# Patient Record
Sex: Female | Born: 2000 | Race: Black or African American | Hispanic: No | Marital: Single | Smoking: Never smoker
Health system: Southern US, Community
[De-identification: ages and names within clinical notes are randomized; demographics above are authoritative.]

## PROBLEM LIST (undated history)

## (undated) DIAGNOSIS — F913 Oppositional defiant disorder: Secondary | ICD-10-CM

## (undated) DIAGNOSIS — F39 Unspecified mood [affective] disorder: Secondary | ICD-10-CM

## (undated) DIAGNOSIS — F902 Attention-deficit hyperactivity disorder, combined type: Secondary | ICD-10-CM

## (undated) DIAGNOSIS — E669 Obesity, unspecified: Secondary | ICD-10-CM

## (undated) HISTORY — DX: Unspecified mood (affective) disorder: F39

## (undated) HISTORY — DX: Oppositional defiant disorder: F91.3

## (undated) HISTORY — DX: Obesity, unspecified: E66.9

---
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
## 2020-01-02 ENCOUNTER — Other Ambulatory Visit: Payer: Self-pay | Admitting: Obstetrics

## 2020-01-02 DIAGNOSIS — R21 Rash and other nonspecific skin eruption: Secondary | ICD-10-CM

## 2020-01-02 MED ORDER — TRIAMCINOLONE ACETONIDE 0.1 % EX CREA: 1.0000 "application " | TOPICAL_CREAM | Freq: Two times a day (BID) | CUTANEOUS | 0 refills | Status: DC

## 2020-01-12 ENCOUNTER — Other Ambulatory Visit: Payer: Self-pay | Admitting: Obstetrics & Gynecology

## 2020-01-12 DIAGNOSIS — Z30011 Encounter for initial prescription of contraceptive pills: Secondary | ICD-10-CM

## 2020-01-20 MED ORDER — NORGESTIMATE-ETH ESTRADIOL 0.25-35 MG-MCG PO TABS: 1.0000 | ORAL_TABLET | Freq: Every day | ORAL | 4 refills | Status: DC

## 2020-01-20 NOTE — Addendum Note (Signed)
Addended by: Jaynie Collins A on: 01/20/2020 03:37 PM   Modules accepted: Orders

## 2020-01-29 ENCOUNTER — Encounter: Payer: Self-pay | Admitting: Obstetrics

## 2020-01-29 ENCOUNTER — Other Ambulatory Visit: Payer: Self-pay

## 2020-01-29 ENCOUNTER — Ambulatory Visit (INDEPENDENT_AMBULATORY_CARE_PROVIDER_SITE_OTHER): Payer: Medicaid Other | Admitting: Obstetrics

## 2020-01-29 VITALS — BP 132/82 | HR 93 | Ht 63.5 in | Wt 211.0 lb

## 2020-01-29 DIAGNOSIS — R21 Rash and other nonspecific skin eruption: Secondary | ICD-10-CM | POA: Diagnosis not present

## 2020-01-29 DIAGNOSIS — B369 Superficial mycosis, unspecified: Secondary | ICD-10-CM | POA: Diagnosis not present

## 2020-01-29 DIAGNOSIS — B379 Candidiasis, unspecified: Secondary | ICD-10-CM | POA: Diagnosis not present

## 2020-01-29 NOTE — Progress Notes (Signed)
Pt is in the office for follow up GYN visit related to vaginal itching. Pt states that triamcinolone cream worked and she no longer is having issues.   Patient ID: Brandy Choi, female   DOB: September 08, 2000, 19 y.o.   MRN: [id number]  Chief Complaint  Patient presents with  . Follow-up    HPI Brandy Choi is a 19 y.o. female.  History of vulva / inguinal jock-itch type rash.  Now resolved with Kenalog /  Clotrimazole creams. HPI  Past Medical History:  Diagnosis Date  . ADHD (attention deficit hyperactivity disorder), combined type   . Asthma   . Mood disorder (HCC)   . Obesity   . Oppositional defiant disorder     Past Surgical History:  Procedure Laterality Date  . CESAREAN SECTION N/A 05/28/2018   Procedure: CESAREAN SECTION;  Surgeon: Conan Bowens, MD;  Location: [facility] BIRTHING SUITES;  Service: Obstetrics;  Laterality: N/A;  . WISDOM TOOTH EXTRACTION  2019    Family History  Problem Relation Age of Onset  . ADD / ADHD Brother   . Diabetes Mother   . Congestive Heart Failure Maternal Grandmother   . Renal Disease Maternal Grandmother   . Diabetes Maternal Grandmother   . Hypertension Maternal Grandmother     Social History Social History   Tobacco Use  . Smoking status: Never Smoker  . Smokeless tobacco: Never Used  Vaping Use  . Vaping Use: Never used  Substance Use Topics  . Alcohol use: No  . Drug use: No    No Known Allergies  Current Outpatient Medications  Medication Sig Dispense Refill  . norgestimate-ethinyl estradiol (ESTARYLLA) 0.25-35 MG-MCG tablet Take 1 tablet by mouth daily. 84 tablet 4  . clotrimazole (LOTRIMIN) 1 % cream Apply 1 application topically 2 (two) times daily. (Patient not taking: Reported on 01/29/2020) 113 g 1  . famotidine (PEPCID) 20 MG tablet Take 1 tablet (20 mg total) by mouth 2 (two) times daily. (Patient not taking: Reported on 01/29/2020) 30 tablet 0  . fluconazole (DIFLUCAN) 150 MG tablet Take 1 tablet (150 mg total) by  mouth daily. Repeat in 1 week if needed (Patient not taking: Reported on 12/23/2019) 2 tablet 0  . methylphenidate 18 MG PO CR tablet Take 18 mg by mouth daily. (Patient not taking: Reported on 01/29/2020)    . triamcinolone cream (KENALOG) 0.1 % Apply 1 application topically 2 (two) times daily. (Patient not taking: Reported on 01/29/2020) 80 g 0   No current facility-administered medications for this visit.    Review of Systems Review of Systems Constitutional: negative for fatigue and weight loss Respiratory: negative for cough and wheezing Cardiovascular: negative for chest pain, fatigue and palpitations Gastrointestinal: negative for abdominal pain and change in bowel habits Genitourinary:negative Integument/breast: negative for nipple discharge Musculoskeletal:negative for myalgias Neurological: negative for gait problems and tremors Behavioral/Psych: negative for abusive relationship, depression Endocrine: negative for temperature intolerance      Blood pressure 132/82, pulse 93, height 5' 3.5" (1.613 m), weight 211 lb (95.7 kg), not currently breastfeeding.  Physical Exam Physical Exam: Deferred General:   alert and no distress  Skin:   no rash or abnormalities  Lungs:   clear to auscultation bilaterally  Heart:   regular rate and rhythm, S1, S2 normal, no murmur, click, rub or gallop  Breasts:   normal without suspicious masses, skin or nipple changes or axillary nodes  Abdomen:  normal findings: no organomegaly, soft, non-tender and no hernia  Pelvis:  External genitalia: normal general appearance Urinary system: urethral meatus normal and bladder without fullness, nontender Vaginal: normal without tenderness, induration or masses Cervix: normal appearance Adnexa: normal bimanual exam Uterus: anteverted and non-tender, normal size    50% of 15 min visit spent on counseling and coordination of care.   Data Reviewed Wet Prep  Assessment     1. Candida infection -  resolved  2. Superficial fungus infection of skin - resolved  3. Rash and nonspecific skin eruption - resolved    Plan   Follow up prn  Brock Bad, MD 01/29/2020 4:39 PM

## 2020-02-13 ENCOUNTER — Other Ambulatory Visit (HOSPITAL_COMMUNITY)
Admission: RE | Admit: 2020-02-13 | Discharge: 2020-02-13 | Disposition: A | Discharge status: Home / Self Care | Payer: Medicaid Other | Source: Ambulatory Visit | Attending: Obstetrics and Gynecology | Admitting: Obstetrics and Gynecology

## 2020-02-13 ENCOUNTER — Ambulatory Visit: Payer: Medicaid Other

## 2020-02-13 ENCOUNTER — Other Ambulatory Visit: Payer: Self-pay

## 2020-02-13 DIAGNOSIS — N898 Other specified noninflammatory disorders of vagina: Secondary | ICD-10-CM | POA: Insufficient documentation

## 2020-02-13 NOTE — Progress Notes (Signed)
Patient was assessed and managed by nursing staff during this encounter. I have reviewed the chart and agree with the documentation and plan. I have also made any necessary editorial changes.  Catalina Antigua, MD 02/13/2020 10:46 AM

## 2020-02-13 NOTE — Progress Notes (Signed)
Pt is in the office for self swab, reports vaginal odor, and "sees rolls of dirt on the tissue after wiping", denies discharge.

## 2020-02-14 LAB — CERVICOVAGINAL ANCILLARY ONLY
Bacterial Vaginitis (gardnerella): NEGATIVE
Candida Glabrata: NEGATIVE
Candida Vaginitis: NEGATIVE
Chlamydia: NEGATIVE
Comment: NEGATIVE
Comment: NEGATIVE
Comment: NEGATIVE
Comment: NEGATIVE
Comment: NEGATIVE
Comment: NORMAL
Neisseria Gonorrhea: NEGATIVE
Trichomonas: NEGATIVE

## 2020-02-18 DIAGNOSIS — F331 Major depressive disorder, recurrent, moderate: Secondary | ICD-10-CM | POA: Insufficient documentation

## 2020-02-20 ENCOUNTER — Encounter (HOSPITAL_COMMUNITY): Payer: Self-pay | Admitting: *Deleted

## 2020-02-20 ENCOUNTER — Other Ambulatory Visit: Payer: Self-pay

## 2020-02-20 ENCOUNTER — Emergency Department (HOSPITAL_COMMUNITY): Payer: Medicaid Other

## 2020-02-20 ENCOUNTER — Emergency Department (HOSPITAL_COMMUNITY)
Admission: EM | Admit: 2020-02-20 | Discharge: 2020-02-20 | Disposition: A | Discharge status: Home / Self Care | Payer: Medicaid Other | Attending: Emergency Medicine | Admitting: Emergency Medicine

## 2020-02-20 DIAGNOSIS — J45909 Unspecified asthma, uncomplicated: Secondary | ICD-10-CM | POA: Insufficient documentation

## 2020-02-20 DIAGNOSIS — S199XXA Unspecified injury of neck, initial encounter: Secondary | ICD-10-CM | POA: Diagnosis present

## 2020-02-20 DIAGNOSIS — S161XXA Strain of muscle, fascia and tendon at neck level, initial encounter: Secondary | ICD-10-CM | POA: Insufficient documentation

## 2020-02-20 DIAGNOSIS — X58XXXA Exposure to other specified factors, initial encounter: Secondary | ICD-10-CM | POA: Diagnosis not present

## 2020-02-20 DIAGNOSIS — S40011A Contusion of right shoulder, initial encounter: Secondary | ICD-10-CM | POA: Diagnosis not present

## 2020-02-20 MED ORDER — METHOCARBAMOL 500 MG PO TABS
1000.0000 mg | ORAL_TABLET | Freq: Four times a day (QID) | ORAL | 0 refills | Status: DC
Start: 2020-02-20 — End: 2024-02-21

## 2020-02-20 MED ORDER — NAPROXEN 500 MG PO TABS
500.0000 mg | ORAL_TABLET | Freq: Two times a day (BID) | ORAL | 0 refills | Status: DC
Start: 2020-02-20 — End: 2022-09-12

## 2020-02-20 NOTE — Discharge Instructions (Signed)
Please read and follow all provided instructions.  Your diagnoses today include:  1. Strain of neck muscle, initial encounter   2. Contusion of right shoulder, initial encounter     Tests performed today include:  An x-ray of the affected area - does NOT show any broken bones  Vital signs. See below for your results today.   Medications prescribed:   Take any prescribed medications only as directed.  Home care instructions:   Follow any educational materials contained in this packet  Follow R.I.C.E. Protocol:  R - rest your injury   I  - use ice on injury without applying directly to skin  C - compress injury with bandage or splint  E - elevate the injury as much as possible  Follow-up instructions: Please follow-up with your primary care provider or the provided orthopedic physician (bone specialist) if you continue to have significant pain in 1 week. In this case you may have a more severe injury that requires further care.   Return instructions:   Please return if your fingers are numb or tingling, appear gray or blue, or you have severe pain (also elevate the arm and loosen splint or wrap if you were given one)  Please return to the Emergency Department if you experience worsening symptoms.   Please return if you have any other emergent concerns.  Additional Information:  Your vital signs today were: BP (!) 133/94 (BP Location: Left Arm)   Pulse (!) 102   Temp 98.9 F (37.2 C) (Oral)   Resp 18   Ht 5\' 3"  (1.6 m)   Wt 96.6 kg   LMP 02/06/2020   SpO2 100%   BMI 37.73 kg/m  If your blood pressure (BP) was elevated above 135/85 this visit, please have this repeated by your doctor within one month. --------------
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
## 2020-02-20 NOTE — ED Notes (Signed)
Patient Alert and oriented to baseline. Stable and ambulatory to baseline. Patient verbalized understanding of the discharge instructions.  Patient belongings were taken by the patient.   
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
## 2020-02-22 IMAGING — US US MFM OB FOLLOW-UP
1 series · 13 of 28 positions shown · non-contrast
Comparison: none

[Series 1: us mfm ob follow-up · 13 of 33 slices shown]
[im 2/33]
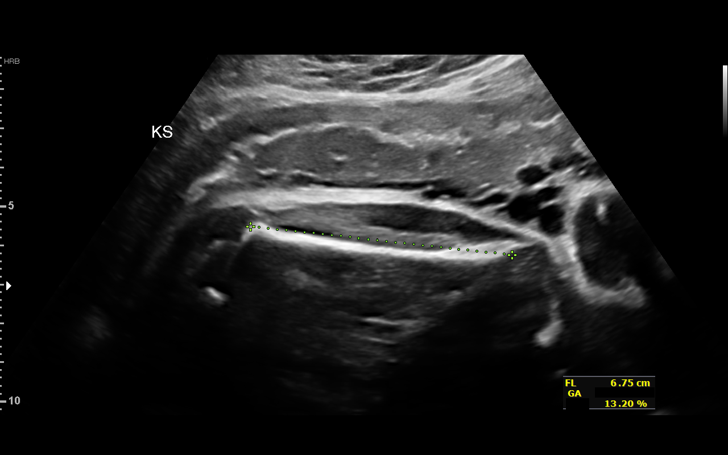
[im 4/33]
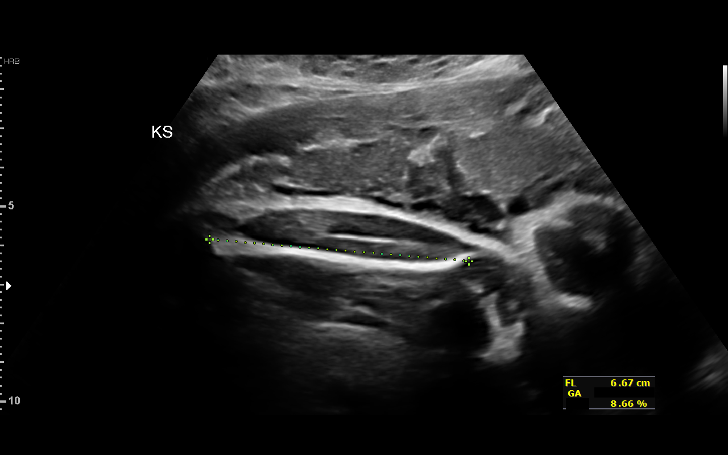
[im 6/33]
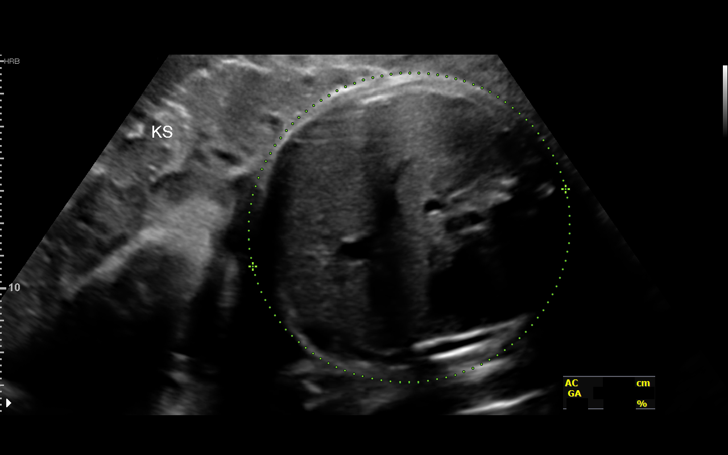
[im 9/33]
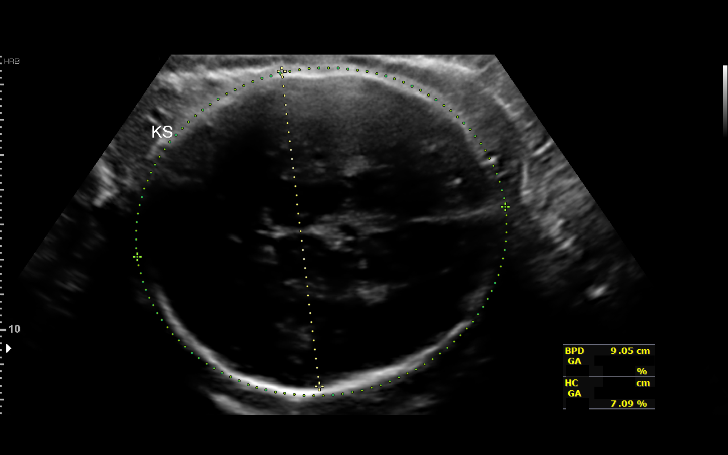
[im 11/33]
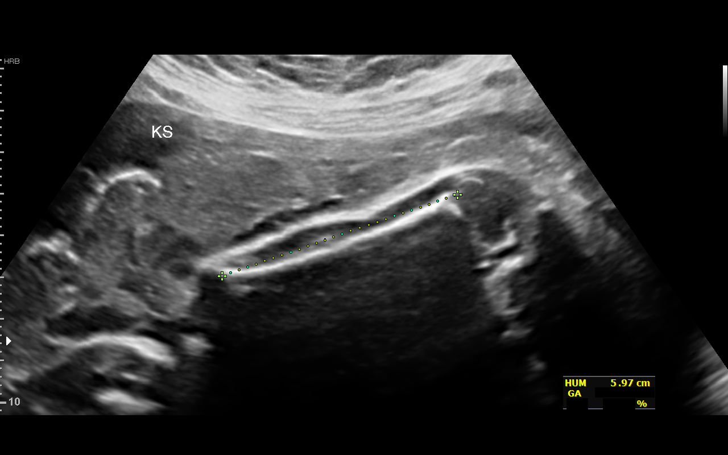
[im 14/33]
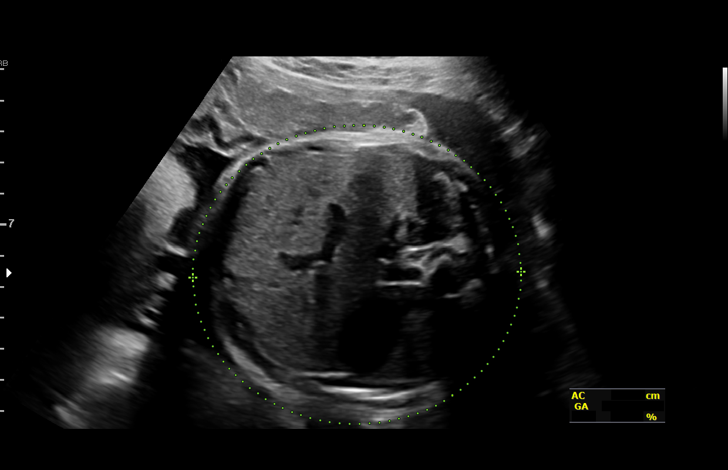
[im 17/33]
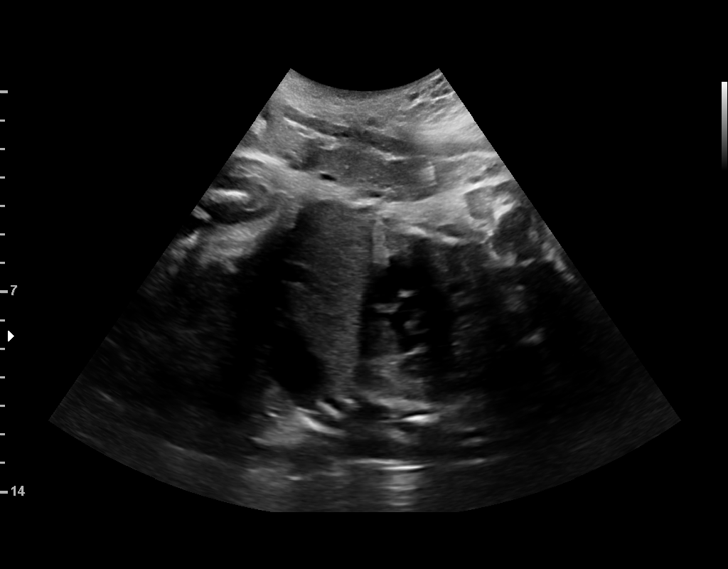
[im 19/33]
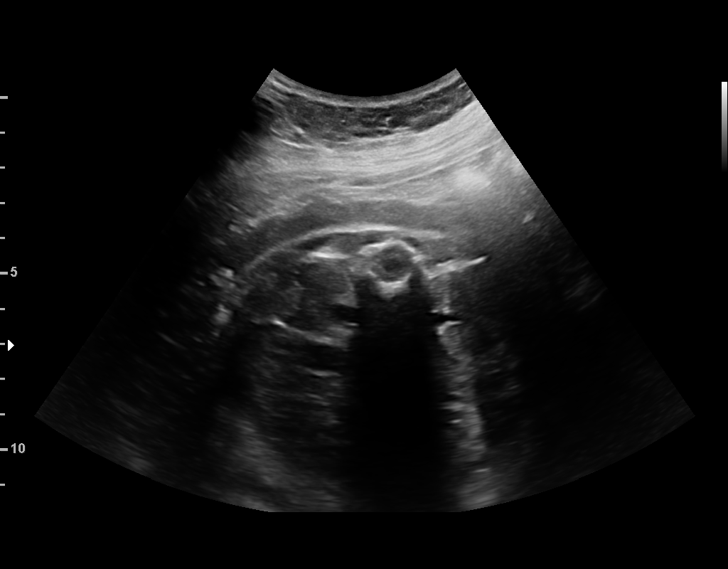
[im 22/33]
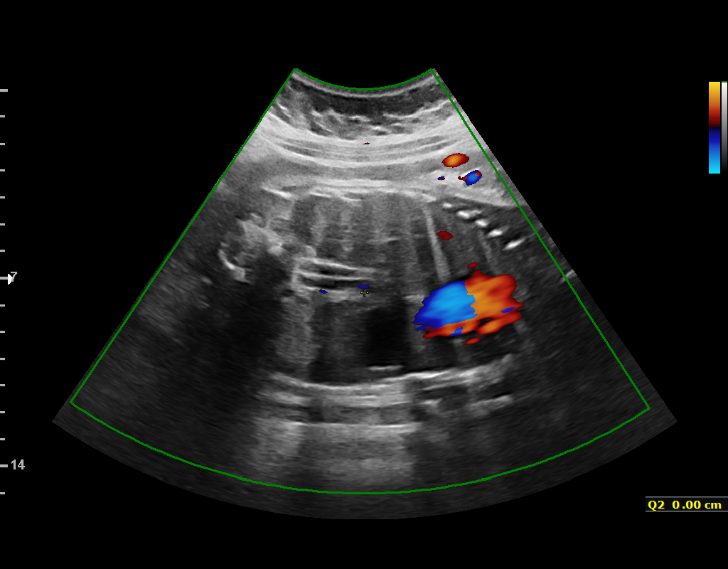
[im 24/33]
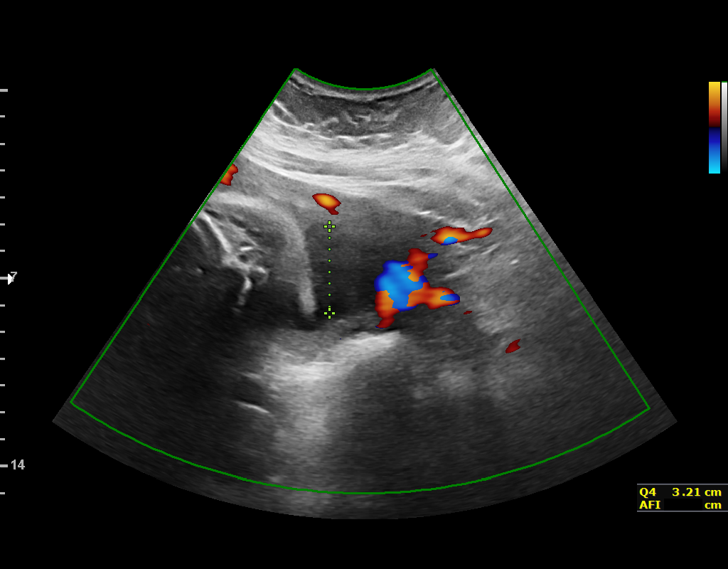
[im 27/33]
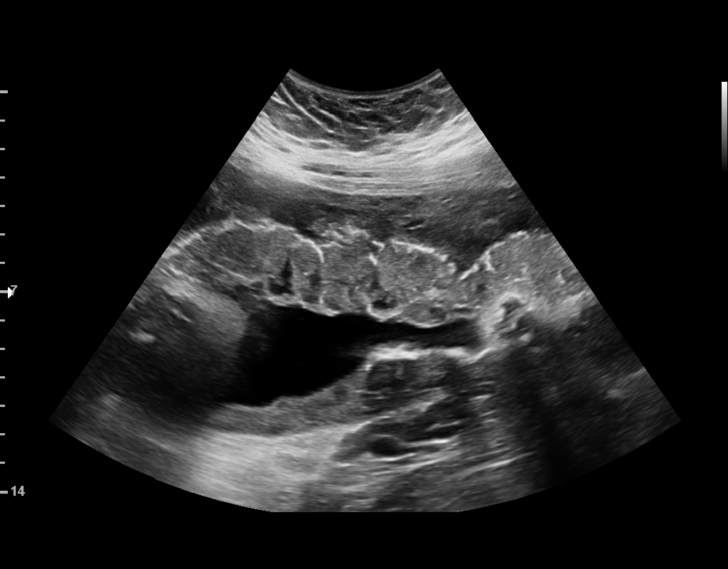
[im 29/33]
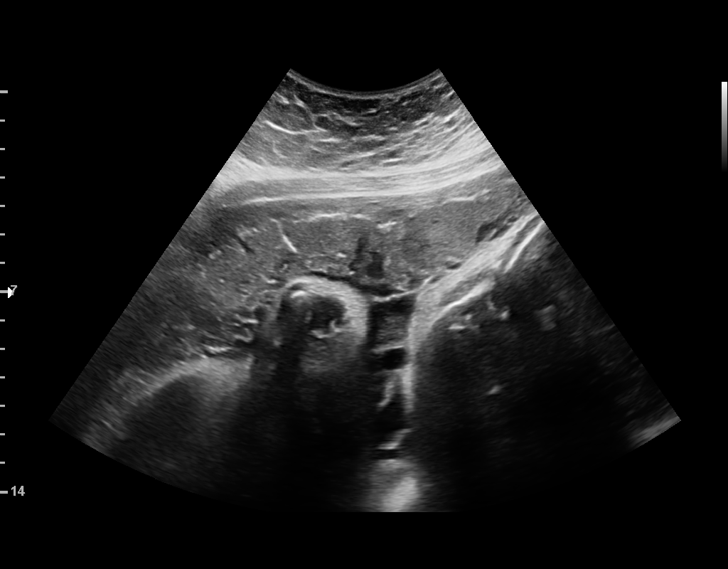
[im 31/33]
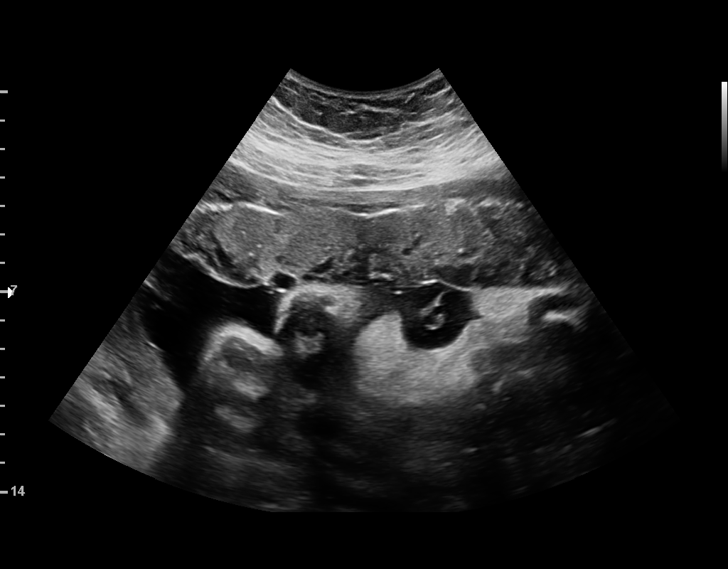

[13 of 28 positions shown; findings below may reference images not displayed]

[REDACTED]care - [HOSPITAL]

 ----------------------------------------------------------------------

 ----------------------------------------------------------------------
Indications

  Late prenatal care, second trimester
  Medication exposure during first trimester of
  pregnancy
  Obesity complicating pregnancy, second
  trimester (prepregnancy BMI 32.6)
  Fetal abnormality - other known or
  suspected (EIF, renal pyelectasis)
  36 weeks gestation of pregnancy
 ----------------------------------------------------------------------
Fetal Evaluation

 Num Of Fetuses:         1
 Cardiac Activity:       Observed
 Presentation:           Cephalic
 Placenta:               Anterior
 P. Cord Insertion:      Previously Visualized

 Amniotic Fluid
 AFI FV:      Within normal limits

 AFI Sum(cm)     %Tile       Largest Pocket(cm)
 11.46           33

 RUQ(cm)       RLQ(cm)       LUQ(cm)        LLQ(cm)
 6.13          3.21          0
Biometry

 BPD:      89.6  mm     G. Age:  36w 2d         63  %    CI:        79.69   %    70 - 86
                                                         FL/HC:      21.2   %    20.1 -
 HC:      317.2  mm     G. Age:  35w 4d         13  %    HC/AC:      1.01        0.93 -
 AC:      313.3  mm     G. Age:  35w 2d         36  %    FL/BPD:     74.9   %    71 - 87
 FL:       67.1  mm     G. Age:  34w 4d         11  %    FL/AC:      21.4   %    20 - 24
 HUM:      59.8  mm     G. Age:  34w 5d         38  %

 Est. FW:    8782  gm    5 lb 12 oz      42  %
OB History

 Gravidity:    1         Term:   0        Prem:   0        SAB:   0
 TOP:          0       Ectopic:  0        Living: 0
Gestational Age

 U/S Today:     35w 3d                                        EDD:   06/01/18
 Best:          36w 1d     Det. By:  U/S  (01/24/18)          EDD:   05/27/18
Anatomy

 Cranium:               Appears normal         Aortic Arch:            Previously seen
 Cavum:                 Appears normal         Ductal Arch:            Previously seen
 Ventricles:            Appears normal         Diaphragm:              Appears normal
 Choroid Plexus:        Previously seen        Stomach:                Appears normal, left
                                                                       sided
 Cerebellum:            Previously seen        Abdomen:                Appears normal
 Posterior Fossa:       Previously seen        Abdominal Wall:         Previously seen
 Nuchal Fold:           Not applicable (>20    Cord Vessels:           Previously seen
                        wks GA)
 Face:                  Orbits and profile     Kidneys:                Appear normal
                        previously seen
 Lips:                  Previously seen        Bladder:                Appears normal
 Thoracic:              Appears normal         Spine:                  Previously seen
 Heart:                 Previously seen        Upper Extremities:      Previously seen
 RVOT:                  Previously seen        Lower Extremities:      Previously seen
 LVOT:                  Previously seen

 Other:  Heels previously visualized. Nasal bone visualized.
Cervix Uterus Adnexa

 Cervix
 Not visualized (advanced GA >00wks)
Impression

 Normal interval growth.
Recommendations

 Follow up as clincally indicated.

## 2020-02-27 ENCOUNTER — Encounter (HOSPITAL_COMMUNITY): Payer: Self-pay

## 2020-02-27 ENCOUNTER — Ambulatory Visit (HOSPITAL_COMMUNITY)
Admission: EM | Admit: 2020-02-27 | Discharge: 2020-02-27 | Disposition: A | Discharge status: Home / Self Care | Payer: Medicaid Other | Attending: Family Medicine | Admitting: Family Medicine

## 2020-02-27 ENCOUNTER — Other Ambulatory Visit: Payer: Self-pay

## 2020-02-27 DIAGNOSIS — M25511 Pain in right shoulder: Secondary | ICD-10-CM | POA: Diagnosis not present

## 2020-02-27 NOTE — Discharge Instructions (Signed)
Work note given for no heavy lifting.  Continue the medicines as needed Follow up with sports medicine.
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
## 2020-03-05 ENCOUNTER — Ambulatory Visit (INDEPENDENT_AMBULATORY_CARE_PROVIDER_SITE_OTHER): Payer: Medicaid Other | Admitting: Sports Medicine

## 2020-03-05 ENCOUNTER — Other Ambulatory Visit: Payer: Self-pay

## 2020-03-05 VITALS — BP 112/82 | Ht 63.5 in | Wt 213.0 lb

## 2020-03-05 DIAGNOSIS — S4351XA Sprain of right acromioclavicular joint, initial encounter: Secondary | ICD-10-CM | POA: Diagnosis not present
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
## 2020-03-11 ENCOUNTER — Other Ambulatory Visit: Payer: Self-pay

## 2020-03-11 ENCOUNTER — Ambulatory Visit (INDEPENDENT_AMBULATORY_CARE_PROVIDER_SITE_OTHER): Payer: Medicaid Other

## 2020-03-11 DIAGNOSIS — Z3200 Encounter for pregnancy test, result unknown: Secondary | ICD-10-CM

## 2020-03-11 LAB — POCT URINE PREGNANCY: Preg Test, Ur: NEGATIVE

## 2020-03-11 NOTE — Progress Notes (Signed)
Patient was assessed and managed by nursing staff during this encounter. I have reviewed the chart and agree with the documentation and plan. I have also made any necessary editorial changes.  Shilah Hefel A Edd Reppert, MD 03/11/2020 4:17 PM   

## 2020-03-11 NOTE — Progress Notes (Addendum)
Brandy Choi presents today for UPT. Pt is taking BCP every morning. Denies missing pills, denies missing a period. She vomited once over the weekend with stomach pain and "just for a peace of mind," she requests UPT  LMP: 03/06/20     OBJECTIVE: Appears well, in no apparent distress.  OB History    Gravida  1   Para  1   Term  1   Preterm      AB      Living  1     SAB      TAB      Ectopic      Multiple  0   Live Births  1          Home UPT Result: Didn't take one  In-Office UPT result: Negative x2 I have reviewed the patient's medical, obstetrical, social, and family histories, and medications.   ASSESSMENT: negative pregnancy test  PLAN Continue BCP daily if pregnancy is not desired at this time

## 2020-06-23 ENCOUNTER — Other Ambulatory Visit: Payer: Self-pay

## 2020-06-23 ENCOUNTER — Ambulatory Visit (INDEPENDENT_AMBULATORY_CARE_PROVIDER_SITE_OTHER): Payer: Medicaid Other

## 2020-06-23 ENCOUNTER — Other Ambulatory Visit (HOSPITAL_COMMUNITY)
Admission: RE | Admit: 2020-06-23 | Discharge: 2020-06-23 | Disposition: A | Discharge status: Home / Self Care | Payer: Medicaid Other | Source: Ambulatory Visit | Attending: Obstetrics | Admitting: Obstetrics

## 2020-06-23 VITALS — BP 119/80 | HR 103 | Wt 228.0 lb

## 2020-06-23 DIAGNOSIS — Z113 Encounter for screening for infections with a predominantly sexual mode of transmission: Secondary | ICD-10-CM | POA: Insufficient documentation

## 2020-06-23 NOTE — Progress Notes (Addendum)
  SUBJECTIVE:  20 y.o. female requesting STD Screening.  Denies vaginal discharge, odor, abnormal vaginal bleeding or significant pelvic pain or fever. No UTI symptoms. Denies history of known exposure to STD.  Patient's last menstrual period was 05/25/2020 (approximate).  OBJECTIVE:  She appears well, afebrile. Urine dipstick: not done.  ASSESSMENT:  Patient requested routine STD screening   PLAN:  GC, chlamydia, trichomonas, BVAG, CVAG probe, STD Panel sent to lab.  Treatment: To be determined once lab results are received ROV prn if symptoms persist or worsen.  Patient was assessed and managed by nursing staff during this encounter. I have reviewed the chart and agree with the documentation and plan. I have also made any necessary editorial changes.  Coral Ceo, MD 06/23/2020 10:56 AM

## 2020-06-24 LAB — CERVICOVAGINAL ANCILLARY ONLY
Bacterial Vaginitis (gardnerella): NEGATIVE
Candida Glabrata: NEGATIVE
Candida Vaginitis: POSITIVE — AB
Chlamydia: NEGATIVE
Comment: NEGATIVE
Comment: NEGATIVE
Comment: NEGATIVE
Comment: NEGATIVE
Comment: NEGATIVE
Comment: NORMAL
Neisseria Gonorrhea: NEGATIVE
Trichomonas: NEGATIVE

## 2020-06-24 LAB — RPR+HBSAG+HCVAB+...
HIV Screen 4th Generation wRfx: NONREACTIVE
Hep C Virus Ab: <0.1 s/co ratio (ref 0.0–0.9)
Hepatitis B Surface Ag: NEGATIVE
RPR Ser Ql: NONREACTIVE

## 2020-06-25 ENCOUNTER — Other Ambulatory Visit: Payer: Self-pay | Admitting: Obstetrics

## 2020-06-25 DIAGNOSIS — B3731 Acute candidiasis of vulva and vagina: Secondary | ICD-10-CM

## 2020-06-25 DIAGNOSIS — B373 Candidiasis of vulva and vagina: Secondary | ICD-10-CM

## 2020-06-25 DIAGNOSIS — B9689 Other specified bacterial agents as the cause of diseases classified elsewhere: Secondary | ICD-10-CM

## 2020-06-25 DIAGNOSIS — N760 Acute vaginitis: Secondary | ICD-10-CM

## 2020-06-25 MED ORDER — FLUCONAZOLE 150 MG PO TABS: 150.0000 mg | ORAL_TABLET | Freq: Once | ORAL | 0 refills | Status: AC

## 2020-08-10 ENCOUNTER — Other Ambulatory Visit: Payer: Self-pay

## 2020-08-10 MED ORDER — FLUCONAZOLE 150 MG PO TABS: 150.0000 mg | ORAL_TABLET | Freq: Once | ORAL | 0 refills | Status: AC

## 2020-08-13 ENCOUNTER — Other Ambulatory Visit (HOSPITAL_COMMUNITY)
Admission: RE | Admit: 2020-08-13 | Discharge: 2020-08-13 | Disposition: A | Discharge status: Home / Self Care | Payer: Medicaid Other | Source: Ambulatory Visit | Attending: Obstetrics | Admitting: Obstetrics

## 2020-08-13 ENCOUNTER — Other Ambulatory Visit: Payer: Self-pay

## 2020-08-13 ENCOUNTER — Ambulatory Visit (INDEPENDENT_AMBULATORY_CARE_PROVIDER_SITE_OTHER): Payer: Medicaid Other

## 2020-08-13 VITALS — BP 122/85 | HR 99

## 2020-08-13 DIAGNOSIS — N898 Other specified noninflammatory disorders of vagina: Secondary | ICD-10-CM

## 2020-08-13 NOTE — Progress Notes (Signed)
SUBJECTIVE:  19 y.o. female complains of vaginal discharge for a couple of days. Denies abnormal vaginal bleeding or significant pelvic pain.  fever. No UTI symptoms. Denies history of known exposure to STD.  No LMP recorded.  OBJECTIVE:  She appears well, afebrile. Urine dipstick: not done  ASSESSMENT:  Vaginal Discharge; none  Vaginal Odor: none   PLAN:  GC, chlamydia, trichomonas, BVAG, CVAG probe sent to lab. Patient request to be check for yeast and BV.  Treatment: To be determined once lab results are received ROV prn if symptoms persist or worsen.

## 2020-08-14 LAB — CERVICOVAGINAL ANCILLARY ONLY
Bacterial Vaginitis (gardnerella): NEGATIVE
Candida Glabrata: NEGATIVE
Candida Vaginitis: NEGATIVE
Chlamydia: NEGATIVE
Comment: NEGATIVE
Comment: NEGATIVE
Comment: NEGATIVE
Comment: NEGATIVE
Comment: NEGATIVE
Comment: NORMAL
Neisseria Gonorrhea: NEGATIVE
Trichomonas: NEGATIVE

## 2020-12-26 ENCOUNTER — Ambulatory Visit (HOSPITAL_COMMUNITY)
Admission: EM | Admit: 2020-12-26 | Discharge: 2020-12-26 | Disposition: A | Discharge status: Home / Self Care | Payer: Medicaid Other | Attending: Internal Medicine | Admitting: Internal Medicine

## 2020-12-26 ENCOUNTER — Other Ambulatory Visit: Payer: Self-pay

## 2020-12-26 ENCOUNTER — Encounter (HOSPITAL_COMMUNITY): Payer: Self-pay

## 2020-12-26 DIAGNOSIS — J029 Acute pharyngitis, unspecified: Secondary | ICD-10-CM | POA: Insufficient documentation

## 2020-12-26 LAB — POCT RAPID STREP A, ED / UC: Streptococcus, Group A Screen (Direct): NEGATIVE

## 2020-12-26 NOTE — ED Provider Notes (Signed)
[facility]    CSN: [id number] Arrival date & time: 12/26/20  1000      History   Chief Complaint Chief Complaint  Patient presents with   Sore Throat    HPI Brandy Choi is a 20 y.o. female.   Patient here for evaluation of sore throat and painful swallowing that has been ongoing for the past few days.  Reports taking OTC cough medicine with minimal relief.  Reports taking ibuprofen which did help with pain.  Denies any recent sick contacts.  Denies any trauma, injury, or other precipitating event.  Denies any fevers, chest pain, shortness of breath, N/V/D, numbness, tingling, weakness, abdominal pain, or headaches.     The history is provided by the patient.  Sore Throat   Past Medical History:  Diagnosis Date   ADHD (attention deficit hyperactivity disorder), combined type    Asthma    Mood disorder (HCC)    Obesity    Oppositional defiant disorder     Patient Active Problem List   Diagnosis Date Noted   Postpartum care following cesarean delivery 06/12/2018   Obesity (BMI 30.0-34.9) 01/18/2018   Severe obesity due to excess calories without serious comorbidity with body mass index (BMI) in 99th percentile for age in pediatric patient (HCC) 10/27/2016   Mood disorder (HCC) 10/01/2015   Oppositional defiant disorder 10/01/2015   ADHD (attention deficit hyperactivity disorder), combined type 01/01/2013    Past Surgical History:  Procedure Laterality Date   CESAREAN SECTION N/A 05/28/2018   Procedure: CESAREAN SECTION;  Surgeon: Conan Bowens, MD;  Location: [facility] BIRTHING SUIT[facility];  Service: Obstetrics;  Laterality: N/A;   WISDOM TOOTH EXTRACTION  2019    OB History     Gravida  1   Para  1   Term  1   Preterm      AB      Living  1      SAB      IAB      Ectopic      Multiple  0   Live Births  1            Home Medications    Prior to Admission medications   Medication Sig Start Date End Date Taking? Authorizing Provider   methocarbamol (ROBAXIN) 500 MG tablet Take 2 tablets (1,000 mg total) by mouth 4 (four) times daily. Patient not taking: Reported on 03/11/2020 02/20/20   Renne Crigler, PA-C  naproxen (NAPROSYN) 500 MG tablet Take 1 tablet (500 mg total) by mouth 2 (two) times daily. Patient not taking: Reported on 03/11/2020 02/20/20   Renne Crigler, PA-C  norgestimate-ethinyl estradiol (ESTARYLLA) 0.25-35 MG-MCG tablet Take 1 tablet by mouth daily. 01/20/20   Anyanwu, Jethro Bastos, MD  famotidine (PEPCID) 20 MG tablet Take 1 tablet (20 mg total) by mouth 2 (two) times daily. Patient not taking: Reported on 01/29/2020 11/24/19 02/20/20  Jeanie Sewer, PA-C    Family History Family History  Problem Relation Age of Onset   ADD / ADHD Brother    Diabetes Mother    Congestive Heart Failure Maternal Grandmother    Renal Disease Maternal Grandmother    Diabetes Maternal Grandmother    Hypertension Maternal Grandmother     Social History Social History   Tobacco Use   Smoking status: Never   Smokeless tobacco: Never  Vaping Use   Vaping Use: Never used  Substance Use Topics   Alcohol use: No   Drug use: No  Allergies   Patient has no known allergies.   Review of Systems Review of Systems  Constitutional:  Negative for fever.  HENT:  Positive for sore throat and trouble swallowing.   All other systems reviewed and are negative.   Physical Exam Triage Vital Signs ED Triage Vitals  Enc Vitals Group     BP 12/26/20 1038 126/82     Pulse Rate 12/26/20 1038 (!) 105     Resp 12/26/20 1038 18     Temp 12/26/20 1038 98.5 F (36.9 C)     Temp Source 12/26/20 1038 Oral     SpO2 12/26/20 1038 99 %     Weight --      Height --      Head Circumference --      Peak Flow --      Pain Score 12/26/20 1037 10     Pain Loc --      Pain Edu? --      Excl. in [place]? --    No data found.  Updated Vital Signs BP 126/82 (BP Location: Right Arm)   Pulse (!) 105   Temp 98.5 F (36.9 C) (Oral)   Resp  18   SpO2 99%   Visual Acuity Right Eye Distance:   Left Eye Distance:   Bilateral Distance:    Right Eye Near:   Left Eye Near:    Bilateral Near:     Physical Exam Vitals and nursing note reviewed.  Constitutional:      General: She is not in acute distress.    Appearance: Normal appearance. She is not ill-appearing, toxic-appearing or diaphoretic.  HENT:     Head: Normocephalic and atraumatic.     Right Ear: Tympanic membrane and ear canal normal.     Left Ear: Tympanic membrane and ear canal normal.     Nose: No congestion.     Mouth/Throat:     Pharynx: Uvula midline. Pharyngeal swelling, oropharyngeal exudate and posterior oropharyngeal erythema present.     Tonsils: Tonsillar exudate present. No tonsillar abscesses. 2+ on the right. 2+ on the left.  Eyes:     Conjunctiva/sclera: Conjunctivae normal.  Cardiovascular:     Rate and Rhythm: Normal rate.     Pulses: Normal pulses.  Pulmonary:     Effort: Pulmonary effort is normal.  Abdominal:     General: Abdomen is flat.  Musculoskeletal:        General: Normal range of motion.     Cervical back: Normal range of motion.  Skin:    General: Skin is warm and dry.  Neurological:     General: No focal deficit present.     Mental Status: She is alert and oriented to person, place, and time.  Psychiatric:        Mood and Affect: Mood normal.     UC Treatments / Results  Labs (all labs ordered are listed, but only abnormal results are displayed) Labs Reviewed  CULTURE, GROUP A STREP [facility])  POCT RAPID STREP A, ED / UC    EKG   Radiology No results found.  Procedures Procedures (including critical care time)  Medications Ordered in UC Medications - No data to display  Initial Impression / Assessment and Plan / UC Course  I have reviewed the triage vital signs and the nursing notes.  Pertinent labs & imaging results that were available during my care of the patient were reviewed by me and considered in  my medical decision making (  see chart for details).    Assessment negative for red flags or concerns.  Rapid strep negative, throat culture pending.  Declines COVID test stating that she can just do the home test.  Discussed conservative symptom management as described in discharge instructions.  Continue ibuprofen and/or Tylenol as needed for pain and fevers.  Encouraged fluids and rest.  Follow up as needed.  Final Clinical Impressions(s) / UC Diagnoses   Final diagnoses:  Viral pharyngitis     Discharge Instructions      You can take Tylenol and/or Ibuprofen as needed for fever reduction and pain relief.   For cough: honey 1/2 to 1 teaspoon (you can dilute the honey in water or another fluid).  You can also use guaifenesin and dextromethorphan for cough. You can use a humidifier for chest congestion and cough.  If you don't have a humidifier, you can sit in the bathroom with the hot shower running.     For sore throat: try warm salt water gargles, cepacol lozenges, throat spray, warm tea or water with lemon/honey, popsicles or ice, or OTC cold relief medicine for throat discomfort.    For congestion: take a daily anti-histamine like Zyrtec, Claritin, and a oral decongestant, such as pseudoephedrine.  You can also use Flonase 1-2 sprays in each nostril daily.    It is important to stay hydrated: drink plenty of fluids (water, gatorade/powerade/pedialyte, juices, or teas) to keep your throat moisturized and help further relieve irritation/discomfort.   Return or go to the Emergency Department if symptoms worsen or do not improve in the next few days.      ED Prescriptions   None    PDMP not reviewed this encounter.   Ivette Loyal, NP 12/26/20 1105

## 2020-12-26 NOTE — Discharge Instructions (Addendum)

## 2020-12-26 NOTE — ED Triage Notes (Signed)
Pt present sore throat with difficulty swallowing. Symptoms started three days ago.

## 2020-12-28 LAB — CULTURE, GROUP A STREP (THRC)

## 2020-12-31 ENCOUNTER — Other Ambulatory Visit: Payer: Self-pay

## 2020-12-31 ENCOUNTER — Ambulatory Visit (INDEPENDENT_AMBULATORY_CARE_PROVIDER_SITE_OTHER): Payer: Medicaid Other

## 2020-12-31 ENCOUNTER — Other Ambulatory Visit (HOSPITAL_COMMUNITY)
Admission: RE | Admit: 2020-12-31 | Discharge: 2020-12-31 | Disposition: A | Discharge status: Home / Self Care | Payer: Medicaid Other | Source: Ambulatory Visit | Attending: Obstetrics and Gynecology | Admitting: Obstetrics and Gynecology

## 2020-12-31 VITALS — BP 127/89 | HR 72

## 2020-12-31 DIAGNOSIS — N898 Other specified noninflammatory disorders of vagina: Secondary | ICD-10-CM | POA: Insufficient documentation

## 2020-12-31 NOTE — Progress Notes (Addendum)
SUBJECTIVE:  20 y.o. female complains of white vaginal discharge for a couple days. Denies abnormal vaginal bleeding or significant pelvic pain or fever. No UTI symptoms. Denies history of known exposure to STD.  No LMP recorded.  OBJECTIVE:  She appears well, afebrile. Urine dipstick: not done.  ASSESSMENT:  Vaginal Discharge : small amount  Vaginal Odor: small amount    PLAN:  GC, chlamydia, trichomonas, BVAG, CVAG probe sent to lab. Treatment: To be determined once lab results are received ROV prn if symptoms persist or worsen.

## 2021-01-01 LAB — CERVICOVAGINAL ANCILLARY ONLY
Bacterial Vaginitis (gardnerella): POSITIVE — AB
Candida Glabrata: NEGATIVE
Candida Vaginitis: POSITIVE — AB
Chlamydia: NEGATIVE
Comment: NEGATIVE
Comment: NEGATIVE
Comment: NEGATIVE
Comment: NEGATIVE
Comment: NEGATIVE
Comment: NORMAL
Neisseria Gonorrhea: NEGATIVE
Trichomonas: NEGATIVE

## 2021-01-02 ENCOUNTER — Other Ambulatory Visit: Payer: Self-pay | Admitting: Obstetrics

## 2021-01-02 DIAGNOSIS — B3731 Acute candidiasis of vulva and vagina: Secondary | ICD-10-CM

## 2021-01-02 DIAGNOSIS — B373 Candidiasis of vulva and vagina: Secondary | ICD-10-CM

## 2021-01-02 DIAGNOSIS — N898 Other specified noninflammatory disorders of vagina: Secondary | ICD-10-CM

## 2021-01-02 DIAGNOSIS — B9689 Other specified bacterial agents as the cause of diseases classified elsewhere: Secondary | ICD-10-CM

## 2021-01-02 DIAGNOSIS — N760 Acute vaginitis: Secondary | ICD-10-CM

## 2021-01-02 MED ORDER — FLUCONAZOLE 150 MG PO TABS
150.0000 mg | ORAL_TABLET | Freq: Once | ORAL | 0 refills | Status: AC
Start: 2021-01-02 — End: 2021-01-02

## 2021-01-02 MED ORDER — METRONIDAZOLE 500 MG PO TABS
500.0000 mg | ORAL_TABLET | Freq: Two times a day (BID) | ORAL | 2 refills | Status: DC
Start: 2021-01-02 — End: 2022-02-16

## 2021-01-02 NOTE — Progress Notes (Signed)
flag

## 2021-03-04 ENCOUNTER — Other Ambulatory Visit: Payer: Self-pay | Admitting: Obstetrics & Gynecology

## 2021-03-04 DIAGNOSIS — Z30011 Encounter for initial prescription of contraceptive pills: Secondary | ICD-10-CM

## 2021-03-04 MED ORDER — NORGESTIMATE-ETH ESTRADIOL 0.25-35 MG-MCG PO TABS: 1.0000 | ORAL_TABLET | Freq: Every day | ORAL | 4 refills | Status: DC

## 2021-04-18 ENCOUNTER — Ambulatory Visit
Admission: EM | Admit: 2021-04-18 | Discharge: 2021-04-18 | Disposition: A | Discharge status: Home / Self Care | Payer: Medicaid Other | Attending: Physician Assistant | Admitting: Physician Assistant

## 2021-04-18 ENCOUNTER — Other Ambulatory Visit: Payer: Self-pay

## 2021-04-18 DIAGNOSIS — S0592XA Unspecified injury of left eye and orbit, initial encounter: Secondary | ICD-10-CM | POA: Diagnosis not present

## 2021-04-18 NOTE — Discharge Instructions (Signed)
Follow up with any further concerns.  

## 2021-04-18 NOTE — ED Triage Notes (Signed)
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 Pt flushed her eye a few times at work. Pt c/o intermittent blurred vision and drainage. No right eye sxs.

## 2021-04-18 NOTE — ED Provider Notes (Signed)
[facility]    CSN: [id number] Arrival date & time: 04/18/21  0806      History   Chief Complaint Chief Complaint  Patient presents with   Eye Problem    left    HPI Brandy Choi is a 20 y.o. female.   Patient here today for evaluation of injury to her left eye that occurred yesterday at work.  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  She states that she immediately closed her eye and in doing so spread to make sure to the remainder of her left eye.  She states she notified her employer and they flushed her eyes several times and she did have some mild relief.  She states yesterday she had more blurred vision which today seems to be somewhat better.  She denies any issues with her right eye.  The history is provided by the patient.  Eye Problem Associated symptoms: no discharge, no nausea, no photophobia, no redness and no vomiting    Past Medical History:  Diagnosis Date   ADHD (attention deficit hyperactivity disorder), combined type    Asthma    Mood disorder (HCC)    Obesity    Oppositional defiant disorder     Patient Active Problem List   Diagnosis Date Noted   Postpartum care following cesarean delivery 06/12/2018   Obesity (BMI 30.0-34.9) 01/18/2018   Severe obesity due to excess calories without serious comorbidity with body mass index (BMI) in 99th percentile for age in pediatric patient (HCC) 10/27/2016   Mood disorder (HCC) 10/01/2015   Oppositional defiant disorder 10/01/2015   ADHD (attention deficit hyperactivity disorder), combined type 01/01/2013    Past Surgical History:  Procedure Laterality Date   CESAREAN SECTION N/A 05/28/2018   Procedure: CESAREAN SECTION;  Surgeon: Conan Bowens, MD;  Location: [facility] BIRTHING SUIT[facility];  Service: Obstetrics;  Laterality: N/A;   WISDOM TOOTH EXTRACTION  2019    OB History     Gravida  1   Para  1   Term  1   Preterm       AB      Living  1      SAB      IAB      Ectopic      Multiple  0   Live Births  1            Home Medications    Prior to Admission medications   Medication Sig Start Date End Date Taking? Authorizing Provider  methocarbamol (ROBAXIN) 500 MG tablet Take 2 tablets (1,000 mg total) by mouth 4 (four) times daily. Patient not taking: Reported on 03/11/2020 02/20/20   Renne Crigler, PA-C  metroNIDAZOLE (FLAGYL) 500 MG tablet Take 1 tablet (500 mg total) by mouth 2 (two) times daily. 01/02/21   Brock Bad, MD  naproxen (NAPROSYN) 500 MG tablet Take 1 tablet (500 mg total) by mouth 2 (two) times daily. Patient not taking: Reported on 03/11/2020 02/20/20   Renne Crigler, PA-C  norgestimate-ethinyl estradiol (ESTARYLLA) 0.25-35 MG-MCG tablet Take 1 tablet by mouth daily. 03/04/21   Anyanwu, Jethro Bastos, MD  famotidine (PEPCID) 20 MG tablet Take 1 tablet (20 mg total) by mouth 2 (two) times daily. Patient not taking: Reported on 01/29/2020 11/24/19 02/20/20  Jeanie Sewer, PA-C    Family History Family History  Problem Relation Age of Onset   ADD /  ADHD Brother    Diabetes Mother    Congestive Heart Failure Maternal Grandmother    Renal Disease Maternal Grandmother    Diabetes Maternal Grandmother    Hypertension Maternal Grandmother     Social History Social History   Tobacco Use   Smoking status: Never   Smokeless tobacco: Never  Vaping Use   Vaping Use: Never used  Substance Use Topics   Alcohol use: No   Drug use: No     Allergies   Patient has no known allergies.   Review of Systems Review of Systems  Constitutional:  Negative for chills and fever.  Eyes:  Positive for visual disturbance. Negative for photophobia, pain, discharge and redness.  Respiratory:  Negative for shortness of breath.   Gastrointestinal:  Negative for abdominal pain, nausea and vomiting.  Genitourinary:  Positive for vaginal bleeding and vaginal discharge.    Physical  Exam Triage Vital Signs ED Triage Vitals  Enc Vitals Group     BP 04/18/21 0820 125/83     Pulse Rate 04/18/21 0820 98     Resp 04/18/21 0820 18     Temp 04/18/21 0820 98.4 F (36.9 C)     Temp Source 04/18/21 0820 Oral     SpO2 04/18/21 0820 95 %     Weight --      Height --      Head Circumference --      Peak Flow --      Pain Score 04/18/21 0822 2     Pain Loc --      Pain Edu? --      Excl. in [place]? --    No data found.  Updated Vital Signs BP 125/83 (BP Location: Left Arm)   Pulse 98   Temp 98.4 F (36.9 C) (Oral)   Resp 18   SpO2 95%   Visual Acuity Right Eye Distance: 20/20 Left Eye Distance: 20/13 Bilateral Distance: 20/15      Physical Exam Vitals and nursing note reviewed.  Constitutional:      General: She is not in acute distress.    Appearance: Normal appearance. She is not ill-appearing.  HENT:     Head: Normocephalic and atraumatic.  Eyes:     General:        Left eye: No foreign body or discharge.     Extraocular Movements: Extraocular movements intact.     Conjunctiva/sclera: Conjunctivae normal.     Right eye: Right conjunctiva is not injected. No exudate.    Left eye: Left conjunctiva is not injected. No exudate.    Pupils: Pupils are equal, round, and reactive to light.     Comments: No area of enhanced fluorescein uptake  Cardiovascular:     Rate and Rhythm: Normal rate.  Pulmonary:     Effort: Pulmonary effort is normal.  Neurological:     Mental Status: She is alert.  Psychiatric:        Mood and Affect: Mood normal.        Behavior: Behavior normal.        Thought Content: Thought content normal.     UC Treatments / Results  Labs (all labs ordered are listed, but only abnormal results are displayed) Labs Reviewed - No data to display  EKG   Radiology No results found.  Procedures Procedures (including critical care time)  Medications Ordered in UC Medications - No data to display  Initial Impression / Assessment  and Plan / UC Course  I have reviewed the triage vital signs and the nursing notes.  Pertinent labs & imaging results that were available during my care of the patient were reviewed by me and considered in my medical decision making (see chart for details).    No corneal abrasion noted on exam. Recommend she continue to monitor symptoms. Recommend follow up with any further concerns.   Final Clinical Impressions(s) / UC Diagnoses   Final diagnoses:  Left eye injury, initial encounter     Discharge Instructions      Follow up with any further concerns.      ED Prescriptions   None    PDMP not reviewed this encounter.   Francene Finders, PA-C 04/18/21 [phone]

## 2021-04-20 ENCOUNTER — Other Ambulatory Visit: Payer: Self-pay

## 2021-04-20 ENCOUNTER — Ambulatory Visit
Admission: EM | Admit: 2021-04-20 | Discharge: 2021-04-20 | Disposition: A | Discharge status: Home / Self Care | Payer: Medicaid Other | Attending: Internal Medicine | Admitting: Internal Medicine

## 2021-04-20 ENCOUNTER — Ambulatory Visit (INDEPENDENT_AMBULATORY_CARE_PROVIDER_SITE_OTHER): Payer: Medicaid Other

## 2021-04-20 DIAGNOSIS — R110 Nausea: Secondary | ICD-10-CM | POA: Diagnosis not present

## 2021-04-20 DIAGNOSIS — Z77098 Contact with and (suspected) exposure to other hazardous, chiefly nonmedicinal, chemicals: Secondary | ICD-10-CM

## 2021-04-20 DIAGNOSIS — R112 Nausea with vomiting, unspecified: Secondary | ICD-10-CM | POA: Diagnosis not present

## 2021-04-20 DIAGNOSIS — R5383 Other fatigue: Secondary | ICD-10-CM | POA: Diagnosis not present

## 2021-04-20 LAB — POCT URINE PREGNANCY: Preg Test, Ur: NEGATIVE

## 2021-04-20 LAB — POCT FASTING CBG KUC MANUAL ENTRY: POCT Glucose (KUC): 107 mg/dL — AB (ref 70–99)

## 2021-04-20 NOTE — ED Triage Notes (Signed)
Pt works at a [employer]. Today, while at work Pt untwisted the cap off her drink while wearing a glove that she uses for chemicals. Pt removed the glove from her other hand and used that same hand to take her oral contraceptive. Pt reports feeling nauseous and light headed after taking the pill. Sxs continued for approx . Pt ate an felt better afterwards, but continues to c/o feeling drained. No v/d.  Pt has been on this birth control for >1 yr and notes sometimes that she feels nauseous after taking the meds.

## 2021-04-20 NOTE — ED Provider Notes (Addendum)
[facility]    CSN: [id number] Arrival date & time: 04/20/21  1108      History   Chief Complaint Chief Complaint  Patient presents with   Nausea    HPI Brandy Choi is a 20 y.o. female.   Patient presents today for further evaluation after possible chemical exposure at work that caused nausea and fatigue.  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  She went to open her drink by twisting the top off with her gloved hand.  Then, proceeded to drink her personal drink and take her birth control pill.  She immediately felt nauseous and like she was "outside of her body".  Those symptoms have now resolved but she still has some fatigue.  Patient denies any shortness of breath, difficulty breathing, sore throat, burning sensation in the throat, dizziness, chest pain.  Patient denies that she ingested any of the chemicals but is not sure if the chemicals were on her glove when she opened her bottle. All of this occurred today a few hours prior to arrival to the urgent care.     Past Medical History:  Diagnosis Date   ADHD (attention deficit hyperactivity disorder), combined type    Asthma    Mood disorder (HCC)    Obesity    Oppositional defiant disorder     Patient Active Problem List   Diagnosis Date Noted   Postpartum care following cesarean delivery 06/12/2018   Obesity (BMI 30.0-34.9) 01/18/2018   Severe obesity due to excess calories without serious comorbidity with body mass index (BMI) in 99th percentile for age in pediatric patient (HCC) 10/27/2016   Mood disorder (HCC) 10/01/2015   Oppositional defiant disorder 10/01/2015   ADHD (attention deficit hyperactivity disorder), combined type 01/01/2013    Past Surgical History:  Procedure Laterality Date   CESAREAN SECTION N/A 05/28/2018   Procedure: CESAREAN SECTION;  Surgeon: Conan Bowens, MD;  Location: [facility];  Service: Obstetrics;  Laterality: N/A;   WISDOM TOOTH EXTRACTION  2019    OB History     Gravida  1   Para  1   Term  1   Preterm      AB      Living  1      SAB      IAB      Ectopic      Multiple  0   Live Births  1            Home Medications    Prior to Admission medications   Medication Sig Start Date End Date Taking? Authorizing Provider  methocarbamol (ROBAXIN) 500 MG tablet Take 2 tablets (1,000 mg total) by mouth 4 (four) times daily. Patient not taking: Reported on 03/11/2020 02/20/20   Renne Crigler, PA-C  metroNIDAZOLE (FLAGYL) 500 MG tablet Take 1 tablet (500 mg total) by mouth 2 (two) times daily. 01/02/21   Brock Bad, MD  naproxen (NAPROSYN) 500 MG tablet Take 1 tablet (500 mg total) by mouth 2 (two) times daily. Patient not taking: Reported on 03/11/2020 02/20/20   Renne Crigler, PA-C  norgestimate-ethinyl estradiol (ESTARYLLA) 0.25-35 MG-MCG tablet Take 1 tablet by mouth daily. 03/04/21   Anyanwu, Jethro Bastos, MD  famotidine (PEPCID) 20 MG tablet Take 1 tablet (20 mg total) by mouth 2 (two) times daily. Patient not taking: Reported on 01/29/2020 11/24/19 02/20/20  Luevenia Maxin,  Mina A, PA-C    Family History Family History  Problem Relation Age of Onset   ADD / ADHD Brother    Diabetes Mother    Congestive Heart Failure Maternal Grandmother    Renal Disease Maternal Grandmother    Diabetes Maternal Grandmother    Hypertension Maternal Grandmother     Social History Social History   Tobacco Use   Smoking status: Never   Smokeless tobacco: Never  Vaping Use   Vaping Use: Never used  Substance Use Topics   Alcohol use: No   Drug use: No     Allergies   Patient has no known allergies.   Review of Systems Review of Systems Per HPI  Physical Exam Triage Vital Signs ED Triage Vitals  Enc Vitals Group     BP 04/20/21 1220 121/81     Pulse Rate 04/20/21 1220 (!) 101     Resp 04/20/21 1220 18     Temp 04/20/21 1220 98.7  F (37.1 C)     Temp Source 04/20/21 1220 Oral     SpO2 04/20/21 1220 98 %     Weight --      Height --      Head Circumference --      Peak Flow --      Pain Score 04/20/21 1227 0     Pain Loc --      Pain Edu? --      Excl. in GC? --    No data found.  Updated Vital Signs BP 121/81 (BP Location: Left Arm)   Pulse (!) 101   Temp 98.7 F (37.1 C) (Oral)   Resp 18   LMP 03/17/2021 (Approximate)   SpO2 98%   Visual Acuity Right Eye Distance:   Left Eye Distance:   Bilateral Distance:    Right Eye Near:   Left Eye Near:    Bilateral Near:     Physical Exam Constitutional:      General: She is not in acute distress.    Appearance: Normal appearance. She is not toxic-appearing or diaphoretic.  HENT:     Head: Normocephalic and atraumatic.     Right Ear: Tympanic membrane and ear canal normal.     Left Ear: Tympanic membrane and ear canal normal.     Nose: Nose normal.     Mouth/Throat:     Lips: Pink.     Mouth: Mucous membranes are moist.     Pharynx: No posterior oropharyngeal erythema or uvula swelling.  Eyes:     Extraocular Movements: Extraocular movements intact.     Conjunctiva/sclera: Conjunctivae normal.     Pupils: Pupils are equal, round, and reactive to light.  Cardiovascular:     Rate and Rhythm: Normal rate and regular rhythm.     Pulses: Normal pulses.  Pulmonary:     Effort: Pulmonary effort is normal. No respiratory distress.     Breath sounds: Normal breath sounds.  Abdominal:     General: Bowel sounds are normal. There is no distension.     Palpations: Abdomen is soft.     Tenderness: There is no abdominal tenderness.  Skin:    General: Skin is warm and dry.  Neurological:     General: No focal deficit present.     Mental Status: She is alert and oriented to person, place, and time. Mental status is at baseline.     Cranial Nerves: Cranial nerves 2-12 are intact.     Sensory: Sensation is  intact.     Motor: Motor function is intact.      Coordination: Coordination is intact.     Gait: Gait is intact.  Psychiatric:        Mood and Affect: Mood normal.        Behavior: Behavior normal.        Thought Content: Thought content normal.        Judgment: Judgment normal.     UC Treatments / Results  Labs (all labs ordered are listed, but only abnormal results are displayed) Labs Reviewed  POCT FASTING CBG [facility] MANUAL ENTRY - Abnormal; Notable for the following components:      Result Value   POCT Glucose ([facility]) 107 (*)    All other components within normal limits  POCT URINE PREGNANCY    EKG   Radiology DG Chest 2 View  Result Date: 04/20/2021 CLINICAL DATA:  per poison control, possible inhalation of chlorine EXAM: CHEST - 2 VIEW COMPARISON:  02/08/2008 FINDINGS: The heart size and mediastinal contours are within normal limits.No focal airspace disease. No pleural effusion or pneumothorax.No acute osseous abnormality. IMPRESSION: No evidence of acute cardiopulmonary disease. Electronically Signed   By: Caprice Renshaw M.D.   On: 04/20/2021 13:47    Procedures Procedures (including critical care time)  Medications Ordered in UC Medications - No data to display  Initial Impression / Assessment and Plan / UC Course  I have reviewed the triage vital signs and the nursing notes.  Pertinent labs & imaging results that were available during my care of the patient were reviewed by me and considered in my medical decision making (see chart for details).     Poison control was called to assess if inhalation was a factor that could have caused patient's symptoms.  Poison control reported that inhalation of chlorine can cause pulmonary edema.  They suggested doing a chest x-ray to rule out pulmonary complications.  Patient may be discharged with watchful waiting if chest x-ray was normal per poison control.  Chest x-ray showed no acute cardiopulmonary process.  Physical exam was also benign and patient symptoms have seemed to  resolve.  Vital signs are also stable at this time.  Patient is safe for discharge.  Advised patient to go to the hospital if symptoms persist or worsen.  Patient requested blood glucose check which was normal.  Patient verbalized understanding and was agreeable with plan. Final Clinical Impressions(s) / UC Diagnoses   Final diagnoses:  Nausea without vomiting  Other fatigue  Chemical exposure     Discharge Instructions      Your chest x-ray was normal.  Your physical exam appears reassuring.  Please go to the hospital if symptoms persist or worsen for further evaluation and management.    ED Prescriptions   None    PDMP not reviewed this encounter.   Gustavus Bryant, [place] 04/20/21 1400    Gustavus Bryant, [place] 04/20/21 1401    Gustavus Bryant, [place] 04/20/21 1402

## 2021-04-20 NOTE — Discharge Instructions (Signed)
Your chest x-ray was normal.  Your physical exam appears reassuring.  Please go to the hospital if symptoms persist or worsen for further evaluation and management.

## 2021-04-22 ENCOUNTER — Telehealth: Payer: Self-pay | Admitting: Obstetrics

## 2021-04-22 NOTE — Telephone Encounter (Signed)
Patient called to notify us that she is discontinuing her OCP due to side effects.  She feels that the birth control is giving her nausea and she has had an episode of feeling light headed.  She sought medical care for this event at the ED. She has not had any missed menstrual cycles and had negative UPT at the ED.  She is not interested in switching birth control at this time.  She is currently using abstinence as her method.   Offered patient virtual visit with Dr. Clearance Coots to discuss further.  She will call back in a couple of months if she has drastic changes to her cycle.

## 2021-05-25 ENCOUNTER — Telehealth: Payer: Self-pay | Admitting: Family Medicine

## 2021-05-25 MED ORDER — METRONIDAZOLE 500 MG PO TABS: 500.0000 mg | ORAL_TABLET | Freq: Two times a day (BID) | ORAL | 0 refills | Status: DC

## 2021-05-25 NOTE — Telephone Encounter (Signed)
Patient called requested Rx for BV.  Prescription routed to pharmacy per protocol.

## 2021-05-26 ENCOUNTER — Ambulatory Visit: Payer: Medicaid Other | Admitting: Obstetrics and Gynecology

## 2021-08-02 ENCOUNTER — Other Ambulatory Visit: Payer: Self-pay

## 2021-08-02 ENCOUNTER — Ambulatory Visit
Admission: EM | Admit: 2021-08-02 | Discharge: 2021-08-02 | Disposition: A | Discharge status: Home / Self Care | Payer: Medicaid Other | Attending: Physician Assistant | Admitting: Physician Assistant

## 2021-08-02 DIAGNOSIS — N611 Abscess of the breast and nipple: Secondary | ICD-10-CM

## 2021-08-02 LAB — POCT URINE PREGNANCY: Preg Test, Ur: NEGATIVE

## 2021-08-02 MED ORDER — DOXYCYCLINE HYCLATE 100 MG PO CAPS: 100.0000 mg | ORAL_CAPSULE | Freq: Two times a day (BID) | ORAL | 0 refills | Status: DC

## 2021-08-02 NOTE — Discharge Instructions (Signed)
?  Please make appointment with GYN  ASAP!  ? ? ? ?

## 2021-08-02 NOTE — ED Triage Notes (Signed)
Pt c/o bilat breast pain onset ~ Wednesday. States has lump on inner portion of right breast. Also states has breast discharge. States was using birth control but stopped ~ January.  ?

## 2021-08-02 NOTE — ED Provider Notes (Signed)
?[facility] ? ? ? ?CSN: [id number] ?Arrival date & time: 08/02/21  1326 ? ? ?  ? ?History   ?Chief Complaint ?Chief Complaint  ?Patient presents with  ? breast situation  ? ? ?HPI ?Brandy Choi is a 21 y.o. female.  ? ?Patient here today for evaluation of breast pain and drainage that started recently to her right breast. She has low grade fever in office today. She does not report any symptoms to her left breast. She denise any other symptoms.  ? ?The history is provided by the patient.  ? ?Past Medical History:  ?Diagnosis Date  ? ADHD (attention deficit hyperactivity disorder), combined type   ? Asthma   ? Mood disorder (HCC)   ? Obesity   ? Oppositional defiant disorder   ? ? ?Patient Active Problem List  ? Diagnosis Date Noted  ? Postpartum care following cesarean delivery 06/12/2018  ? Obesity (BMI 30.0-34.9) 01/18/2018  ? Severe obesity due to excess calories without serious comorbidity with body mass index (BMI) in 99th percentile for age in pediatric patient (HCC) 10/27/2016  ? Mood disorder (HCC) 10/01/2015  ? Oppositional defiant disorder 10/01/2015  ? ADHD (attention deficit hyperactivity disorder), combined type 01/01/2013  ? ? ?Past Surgical History:  ?Procedure Laterality Date  ? CESAREAN SECTION N/A 05/28/2018  ? Procedure: CESAREAN SECTION;  Surgeon: Conan Bowensavis, Kelly M, MD;  [facility];  Service: Obstetrics;  Laterality: N/A;  ? WISDOM TOOTH EXTRACTION  2019  ? ? ?OB History   ? ? Gravida  ?1  ? Para  ?1  ? Term  ?1  ? Preterm  ?   ? AB  ?   ? Living  ?1  ?  ? ? SAB  ?   ? IAB  ?   ? Ectopic  ?   ? Multiple  ?0  ? Live Births  ?1  ?   ?  ?  ? ? ? ?Home Medications   ? ?Prior to Admission medications   ?Medication Sig Start Date End Date Taking? Authorizing Provider  ?doxycycline (VIBRAMYCIN) 100 MG capsule Take 1 capsule (100 mg total) by mouth 2 (two) times daily. 08/02/21  Yes Tomi BambergerMyers, Jabree Pernice F, PA-C  ?methocarbamol (ROBAXIN) 500 MG tablet Take 2 tablets (1,000 mg total) by  mouth 4 (four) times daily. ?Patient not taking: Reported on 03/11/2020 02/20/20   Renne CriglerGeiple, Joshua, PA-C  ?metroNIDAZOLE (FLAGYL) 500 MG tablet Take 1 tablet (500 mg total) by mouth 2 (two) times daily. 01/02/21   Brock BadHarper, Charles A, MD  ?metroNIDAZOLE (FLAGYL) 500 MG tablet Take 1 tablet (500 mg total) by mouth 2 (two) times daily. 05/25/21   Reva BoresPratt, Tanya S, MD  ?naproxen (NAPROSYN) 500 MG tablet Take 1 tablet (500 mg total) by mouth 2 (two) times daily. ?Patient not taking: Reported on 03/11/2020 02/20/20   Renne CriglerGeiple, Joshua, PA-C  ?norgestimate-ethinyl estradiol (ESTARYLLA) 0.25-35 MG-MCG tablet Take 1 tablet by mouth daily. 03/04/21   Anyanwu, Jethro BastosUgonna A, MD  ?famotidine (PEPCID) 20 MG tablet Take 1 tablet (20 mg total) by mouth 2 (two) times daily. ?Patient not taking: Reported on 01/29/2020 11/24/19 02/20/20  Michela PitcherFawze, Mina A, PA-C  ? ? ?Family History ?Family History  ?Problem Relation Age of Onset  ? ADD / ADHD Brother   ? Diabetes Mother   ? Congestive Heart Failure Maternal Grandmother   ? Renal Disease Maternal Grandmother   ? Diabetes Maternal Grandmother   ? Hypertension Maternal Grandmother   ? ? ?Social  History ?Social History  ? ?Tobacco Use  ? Smoking status: Never  ? Smokeless tobacco: Never  ?Vaping Use  ? Vaping Use: Never used  ?Substance Use Topics  ? Alcohol use: No  ? Drug use: No  ? ? ? ?Allergies   ?Patient has no known allergies. ? ? ?Review of Systems ?Review of Systems  ?Constitutional:  Positive for fever. Negative for chills.  ?Eyes:  Negative for discharge and redness.  ?Gastrointestinal:  Negative for abdominal pain, nausea and vomiting.  ? ? ?Physical Exam ?Triage Vital Signs ?ED Triage Vitals [08/02/21 1403]  ?Enc Vitals Group  ?   BP (!) 106/55  ?   Pulse Rate (!) 104  ?   Resp 18  ?   Temp 99.7 ?F (37.6 ?C)  ?   Temp Source Oral  ?   SpO2 98 %  ?   Weight   ?   Height   ?   Head Circumference   ?   Peak Flow   ?   Pain Score 0  ?   Pain Loc   ?   Pain Edu?   ?   Excl. in GC?   ? ?No data  found. ? ?Updated Vital Signs ?BP (!) 106/55 (BP Location: Left Arm)   Pulse (!) 104   Temp 99.7 ?F (37.6 ?C) (Oral)   Resp 18   SpO2 98%  ? ?Visual Acuity ?Right Eye Distance:   ?Left Eye Distance:   ?Bilateral Distance:   ? ?Right Eye Near:   ?Left Eye Near:    ?Bilateral Near:    ? ?Physical Exam ?Vitals and nursing note reviewed.  ?Constitutional:   ?   General: She is not in acute distress. ?   Appearance: Normal appearance. She is not ill-appearing.  ?HENT:  ?   Head: Normocephalic and atraumatic.  ?Eyes:  ?   Conjunctiva/sclera: Conjunctivae normal.  ?Cardiovascular:  ?   Rate and Rhythm: Normal rate.  ?Pulmonary:  ?   Effort: Pulmonary effort is normal.  ?Chest:  ? ? ?   Comments: Large area of swelling, induration, skin changes- appears dry, larger pores ?Neurological:  ?   Mental Status: She is alert.  ?Psychiatric:     ?   Mood and Affect: Mood normal.     ?   Behavior: Behavior normal.     ?   Thought Content: Thought content normal.  ? ? ? ?UC Treatments / Results  ?Labs ?(all labs ordered are listed, but only abnormal results are displayed) ?Labs Reviewed  ?POCT URINE PREGNANCY  ? ? ?EKG ? ? ?Radiology ?No results found. ? ?Procedures ?Procedures (including critical care time) ? ?Medications Ordered in UC ?Medications - No data to display ? ?Initial Impression / Assessment and Plan / UC Course  ?I have reviewed the triage vital signs and the nursing notes. ? ?Pertinent labs & imaging results that were available during my care of the patient were reviewed by me and considered in my medical decision making (see chart for details). ? ?  ?Will treat to cover abscess but discussed concerns for questionable signs of breast cancer as well given nipple discharge, skin changes- recommended further evaluation by GYN as soon as possible. Patient expressed understanding.  ? ?Final Clinical Impressions(s) / UC Diagnoses  ? ?Final diagnoses:  ?Breast abscess  ? ? ? ?Discharge Instructions   ? ?  ? ?Please make  appointment with GYN  ASAP!  ? ? ? ? ? ? ?  ED Prescriptions   ? ? Medication Sig Dispense Auth. Provider  ? doxycycline (VIBRAMYCIN) 100 MG capsule Take 1 capsule (100 mg total) by mouth 2 (two) times daily. 20 capsule Tomi Bamberger, PA-C  ? ?  ? ?PDMP not reviewed this encounter. ?  ?Tomi Bamberger, PA-C ?08/02/21 1531 ? ?

## 2021-08-03 ENCOUNTER — Encounter: Payer: Self-pay | Admitting: Obstetrics

## 2021-08-03 ENCOUNTER — Other Ambulatory Visit: Payer: Self-pay | Admitting: Obstetrics

## 2021-08-03 ENCOUNTER — Ambulatory Visit (INDEPENDENT_AMBULATORY_CARE_PROVIDER_SITE_OTHER): Payer: Medicaid Other | Admitting: Obstetrics

## 2021-08-03 VITALS — BP 119/81 | HR 116 | Temp 100.4°F | Ht 63.5 in | Wt 259.7 lb

## 2021-08-03 DIAGNOSIS — Z131 Encounter for screening for diabetes mellitus: Secondary | ICD-10-CM

## 2021-08-03 DIAGNOSIS — N611 Abscess of the breast and nipple: Secondary | ICD-10-CM

## 2021-08-03 DIAGNOSIS — N61 Mastitis without abscess: Secondary | ICD-10-CM | POA: Diagnosis not present

## 2021-08-03 DIAGNOSIS — Z6841 Body Mass Index (BMI) 40.0 and over, adult: Secondary | ICD-10-CM

## 2021-08-03 DIAGNOSIS — L0291 Cutaneous abscess, unspecified: Secondary | ICD-10-CM

## 2021-08-03 MED ORDER — METRONIDAZOLE 500 MG PO TABS: 500.0000 mg | ORAL_TABLET | Freq: Two times a day (BID) | ORAL | 0 refills | Status: DC

## 2021-08-03 NOTE — Progress Notes (Signed)
Patient ID: Brandy Choi, female   DOB: Jul 06, 2000, 20 y.o.   MRN: [id number] ? ? ? ?HPI ?Brandy Choi is a 21 y.o. female.  Complains of right breast infection.  Seen at Urgent Care yesterday and started on Doxycycline for infection. ?HPI ? ?Past Medical History:  ?Diagnosis Date  ? ADHD (attention deficit hyperactivity disorder), combined type   ? Asthma   ? Mood disorder (HCC)   ? Obesity   ? Oppositional defiant disorder   ? ? ?Past Surgical History:  ?Procedure Laterality Date  ? CESAREAN SECTION N/A 05/28/2018  ? Procedure: CESAREAN SECTION;  Surgeon: Conan Bowens, MD;  Location: [facility] SUIT[facility];  Service: Obstetrics;  Laterality: N/A;  ? WISDOM TOOTH EXTRACTION  2019  ? ? ?Family History  ?Problem Relation Age of Onset  ? ADD / ADHD Brother   ? Diabetes Mother   ? Congestive Heart Failure Maternal Grandmother   ? Renal Disease Maternal Grandmother   ? Diabetes Maternal Grandmother   ? Hypertension Maternal Grandmother   ? ? ?Social History ?Social History  ? ?Tobacco Use  ? Smoking status: Never  ? Smokeless tobacco: Never  ?Vaping Use  ? Vaping Use: Never used  ?Substance Use Topics  ? Alcohol use: No  ? Drug use: No  ? ? ?No Known Allergies ? ?Current Outpatient Medications  ?Medication Sig Dispense Refill  ? doxycycline (VIBRAMYCIN) 100 MG capsule Take 1 capsule (100 mg total) by mouth 2 (two) times daily. 20 capsule 0  ? metroNIDAZOLE (FLAGYL) 500 MG tablet Take 1 tablet (500 mg total) by mouth 2 (two) times daily. 20 tablet 0  ? methocarbamol (ROBAXIN) 500 MG tablet Take 2 tablets (1,000 mg total) by mouth 4 (four) times daily. 30 tablet 0  ? metroNIDAZOLE (FLAGYL) 500 MG tablet Take 1 tablet (500 mg total) by mouth 2 (two) times daily. 14 tablet 2  ? metroNIDAZOLE (FLAGYL) 500 MG tablet Take 1 tablet (500 mg total) by mouth 2 (two) times daily. 14 tablet 0  ? naproxen (NAPROSYN) 500 MG tablet Take 1 tablet (500 mg total) by mouth 2 (two) times daily. 20 tablet 0  ? norgestimate-ethinyl estradiol  (ESTARYLLA) 0.25-35 MG-MCG tablet Take 1 tablet by mouth daily. (Patient not taking: Reported on 08/03/2021) 84 tablet 4  ? ?No current facility-administered medications for this visit.  ? ? ?Review of Systems ?Review of Systems ?Constitutional: negative for fatigue and weight loss ?Respiratory: negative for cough and wheezing ?Cardiovascular: negative for chest pain, fatigue and palpitations ?Gastrointestinal: negative for abdominal pain and change in bowel habits ?Genitourinary:negative ?Integument/breast: positive for firmness and tenderness and for nipple discharge of right breast  ?Musculoskeletal:negative for myalgias ?Neurological: negative for gait problems and tremors ?Behavioral/Psych: negative for abusive relationship, depression ?Endocrine: negative for temperature intolerance    ?  ?Blood pressure 119/81, pulse (!) 116, temperature (!) 100.4 ?F (38 ?C), height 5' 3.5" (1.613 m), weight 259 lb 11.2 oz (117.8 kg), last menstrual period 07/21/2021. ? ?Physical Exam ?Physical Exam ?General:   Alert and no distress  ?Skin:   no rash or abnormalities  ?Lungs:   clear to auscultation bilaterally  ?Heart:   regular rate and rhythm, S1, S2 normal, no murmur, click, rub or gallop  ?Breasts:   Right breast:  induration and tenderness inner and outer quadrants at 3 o'clock.  No nipple discharge.  ? ?I have spent a total of 20 minutes of face-to-face time, excluding clinical staff time, reviewing notes and preparing to  see patient, ordering tests and/or medications, and counseling the patient.  ? ?Data Reviewed ?Vital Signs ?Medications ? ?Assessment  ?   ?1. Mastitis, right, acute ?Rx: ?- metroNIDAZOLE (FLAGYL) 500 MG tablet; Take 1 tablet (500 mg total) by mouth 2 (two) times daily.  Dispense: 20 tablet; Refill: 0 ?- Ambulatory referral to General Surgery ?- US BREAST LTD UNI RIGHT INC AXILLA; Future ?- MM DIAG BREAST TOMO BILATERAL; Future ?- MM Digital Diagnostic Unilat R; Future ?- CBC ?- Comprehensive metabolic  panel ? ?2. Abscess of breast, right ?Rx: ?- Ambulatory referral to General Surgery ? ?3. Screening for diabetes mellitus ?Rx: ?- Hemoglobin A1c ? ?4. Class 3 severe obesity due to excess calories without serious comorbidity with body mass index (BMI) of 45.0 to 49.9 in adult [facility]) ?Rx: ?- Hemoglobin A1c ? ? ?Plan ?  Follow up in 2 weeks ? ?Orders Placed This Encounter  ?Procedures  ? US BREAST LTD UNI RIGHT INC AXILLA  ?  MCD ?NO PF ?RIGHT BREAST ABSCESS ?STARTED ANTIBIOTICS TODAY ?100.4 FEVER ?[facility] // [place]  ?  Standing Status:   Future  ?  Standing Expiration Date:   08/04/2022  ?  Order Specific Question:   Reason for Exam (SYMPTOM  OR DIAGNOSIS REQUIRED)  ?  Answer:   Mastitis with abscess of right breast.  ?  Order Specific Question:   Preferred imaging location?  ?  Answer:   [facility]  ? MM DIAG BREAST TOMO BILATERAL  ?  MCD ?NO PF ?RIGHT BREAST ABSCESS ?STARTED ANTIBIOTICS TODAY ?100.4 FEVER ?SW [facility] AT [facility] // [place]  ?  Standing Status:   Future  ?  Standing Expiration Date:   07/28/2022  ?  Order Specific Question:   Reason for Exam (SYMPTOM  OR DIAGNOSIS REQUIRED)  ?  Answer:   Mastitis with abscess of right breast.  ?  Order Specific Question:   Is the patient pregnant?  ?  Answer:   No  ?  Order Specific Question:   Preferred imaging location?  ?  Answer:   [facility]  ? MM Digital Diagnostic Unilat R  ?  Standing Status:   Future  ?  Standing Expiration Date:   07/28/2022  ?  Order Specific Question:   Reason for Exam (SYMPTOM  OR DIAGNOSIS REQUIRED)  ?  Answer:   Mastitis with abscess of right breast.  ?  Order Specific Question:   Is the patient pregnant?  ?  Answer:   No  ?  Order Specific Question:   Preferred imaging location?  ?  Answer:   [facility]  ? CBC  ? Hemoglobin A1c  ? Comprehensive metabolic panel  ? Ambulatory referral to General Surgery  ?  Referral Priority:   Routine  ?  Referral Type:   Surgical  ?  Referral Reason:   Specialty Services Required  ?  Requested  Specialty:   General Surgery  ?  Number of Visits Requested:   1  ? ?Meds ordered this encounter  ?Medications  ? metroNIDAZOLE (FLAGYL) 500 MG tablet  ?  Sig: Take 1 tablet (500 mg total) by mouth 2 (two) times daily.  ?  Dispense:  20 tablet  ?  Refill:  0  ? ? ? Brock Bad, MD ?08/03/2021 3:27 PM  ?

## 2021-08-03 NOTE — Progress Notes (Signed)
Breast lump and drainage. Seen at urgent care and started on doxycyline. Here for f/u. ? ?Tachycardia 116, Temp 100.4. Just took first dose antibiotics this am. ?

## 2021-08-12 ENCOUNTER — Other Ambulatory Visit: Payer: Self-pay

## 2021-08-12 ENCOUNTER — Ambulatory Visit
Admission: RE | Admit: 2021-08-12 | Discharge: 2021-08-12 | Disposition: A | Discharge status: Home / Self Care | Payer: Medicaid Other | Source: Ambulatory Visit | Attending: Obstetrics | Admitting: Obstetrics

## 2021-08-12 DIAGNOSIS — N61 Mastitis without abscess: Secondary | ICD-10-CM

## 2021-08-12 DIAGNOSIS — L0291 Cutaneous abscess, unspecified: Secondary | ICD-10-CM

## 2021-08-16 ENCOUNTER — Other Ambulatory Visit: Payer: Self-pay | Admitting: Surgery

## 2021-08-17 LAB — AEROBIC/ANAEROBIC CULTURE W GRAM STAIN (SURGICAL/DEEP WOUND): Gram Stain: NONE SEEN

## 2021-08-23 ENCOUNTER — Other Ambulatory Visit: Payer: Self-pay | Admitting: Surgery

## 2021-11-10 ENCOUNTER — Other Ambulatory Visit: Payer: Self-pay

## 2021-11-10 ENCOUNTER — Encounter: Payer: Self-pay | Admitting: Emergency Medicine

## 2021-11-10 ENCOUNTER — Ambulatory Visit
Admission: EM | Admit: 2021-11-10 | Discharge: 2021-11-10 | Disposition: A | Discharge status: Home / Self Care | Payer: Medicaid Other | Attending: Internal Medicine | Admitting: Internal Medicine

## 2021-11-10 DIAGNOSIS — Z77098 Contact with and (suspected) exposure to other hazardous, chiefly nonmedicinal, chemicals: Secondary | ICD-10-CM | POA: Diagnosis not present

## 2021-11-10 NOTE — ED Provider Notes (Signed)
[facility]    CSN: [id number] Arrival date & time: 11/10/21  1029      History   Chief Complaint Chief Complaint  Patient presents with   Eye Problem    HPI Brandy Choi is a 21 y.o. female.   Patient presents after getting a glue product in her eye this morning.  She states that she was spraying [employer] wonder bond adhesive spray for her hair when accidentally got into her right eye.  Patient has been having persistent throbbing pain in the eye ever since.  Denies any blurry vision.  Patient does not wear contacts.  Patient has not washed out her eye.   Eye Problem   Past Medical History:  Diagnosis Date   ADHD (attention deficit hyperactivity disorder), combined type    Asthma    Mood disorder (HCC)    Obesity    Oppositional defiant disorder     Patient Active Problem List   Diagnosis Date Noted   Postpartum care following cesarean delivery 06/12/2018   Obesity (BMI 30.0-34.9) 01/18/2018   Severe obesity due to excess calories without serious comorbidity with body mass index (BMI) in 99th percentile for age in pediatric patient (HCC) 10/27/2016   Mood disorder (HCC) 10/01/2015   Oppositional defiant disorder 10/01/2015   ADHD (attention deficit hyperactivity disorder), combined type 01/01/2013    Past Surgical History:  Procedure Laterality Date   CESAREAN SECTION N/A 05/28/2018   Procedure: CESAREAN SECTION;  Surgeon: Conan Bowens, MD;  Location: [facility];  Service: Obstetrics;  Laterality: N/A;   WISDOM TOOTH EXTRACTION  2019    OB History     Gravida  1   Para  1   Term  1   Preterm      AB      Living  1      SAB      IAB      Ectopic      Multiple  0   Live Births  1            Home Medications    Prior to Admission medications   Medication Sig Start Date End Date Taking? Authorizing Provider  doxycycline (VIBRAMYCIN) 100 MG capsule Take 1 capsule (100 mg total) by mouth 2 (two) times daily. Patient  not taking: Reported on 11/10/2021 08/02/21   Tomi Bamberger, PA-C  methocarbamol (ROBAXIN) 500 MG tablet Take 2 tablets (1,000 mg total) by mouth 4 (four) times daily. 02/20/20   Renne Crigler, PA-C  metroNIDAZOLE (FLAGYL) 500 MG tablet Take 1 tablet (500 mg total) by mouth 2 (two) times daily. Patient not taking: Reported on 11/10/2021 01/02/21   Brock Bad, MD  metroNIDAZOLE (FLAGYL) 500 MG tablet Take 1 tablet (500 mg total) by mouth 2 (two) times daily. Patient not taking: Reported on 11/10/2021 05/25/21   Reva Bores, MD  metroNIDAZOLE (FLAGYL) 500 MG tablet Take 1 tablet (500 mg total) by mouth 2 (two) times daily. Patient not taking: Reported on 11/10/2021 08/03/21   Brock Bad, MD  naproxen (NAPROSYN) 500 MG tablet Take 1 tablet (500 mg total) by mouth 2 (two) times daily. 02/20/20   Renne Crigler, PA-C  norgestimate-ethinyl estradiol (ESTARYLLA) 0.25-35 MG-MCG tablet Take 1 tablet by mouth daily. Patient not taking: Reported on 08/03/2021 03/04/21   Tereso Newcomer, MD  famotidine (PEPCID) 20 MG tablet Take 1 tablet (20 mg total) by mouth 2 (two) times daily. Patient not taking: Reported on  01/29/2020 11/24/19 02/20/20  Jeanie Sewer, PA-C    Family History Family History  Problem Relation Age of Onset   ADD / ADHD Brother    Diabetes Mother    Congestive Heart Failure Maternal Grandmother    Renal Disease Maternal Grandmother    Diabetes Maternal Grandmother    Hypertension Maternal Grandmother     Social History Social History   Tobacco Use   Smoking status: Never   Smokeless tobacco: Never  Vaping Use   Vaping Use: Never used  Substance Use Topics   Alcohol use: Yes   Drug use: No     Allergies   Patient has no known allergies.   Review of Systems Review of Systems Per HPI  Physical Exam Triage Vital Signs ED Triage Vitals  Enc Vitals Group     BP 11/10/21 1057 106/63     Pulse Rate 11/10/21 1057 93     Resp 11/10/21 1057 (!) 22     Temp  11/10/21 1057 98.5 F (36.9 C)     Temp Source 11/10/21 1057 Oral     SpO2 11/10/21 1057 98 %     Weight --      Height --      Head Circumference --      Peak Flow --      Pain Score 11/10/21 1053 1     Pain Loc --      Pain Edu? --      Excl. in GC? --    No data found.  Updated Vital Signs BP 106/63 (BP Location: Right Arm) Comment (BP Location): large cuff  Pulse 93   Temp 98.5 F (36.9 C) (Oral)   Resp (!) 22   LMP 11/10/2021   SpO2 98%   Visual Acuity Right Eye Distance: 20/30 Left Eye Distance: 20/30 Bilateral Distance: 20/20  Right Eye Near:   Left Eye Near:    Bilateral Near:     Physical Exam Constitutional:      Appearance: Normal appearance.  HENT:     Head: Normocephalic and atraumatic.  Eyes:     General: Lids are normal. Lids are everted, no foreign bodies appreciated. Vision grossly intact. Gaze aligned appropriately.     Extraocular Movements: Extraocular movements intact.     Conjunctiva/sclera: Conjunctivae normal.     Pupils: Pupils are equal, round, and reactive to light.  Pulmonary:     Effort: Pulmonary effort is normal.  Neurological:     General: No focal deficit present.     Mental Status: She is alert and oriented to person, place, and time. Mental status is at baseline.  Psychiatric:        Mood and Affect: Mood normal.        Behavior: Behavior normal.        Thought Content: Thought content normal.        Judgment: Judgment normal.      UC Treatments / Results  Labs (all labs ordered are listed, but only abnormal results are displayed) Labs Reviewed - No data to display  EKG   Radiology No results found.  Procedures Procedures (including critical care time)  Medications Ordered in UC Medications - No data to display  Initial Impression / Assessment and Plan / UC Course  I have reviewed the triage vital signs and the nursing notes.  Pertinent labs & imaging results that were available during my care of the  patient were reviewed by me and considered in my medical  decision making (see chart for details).     There is no obvious abnormality to eye.  Eye was washed out.  I do think that patient needs further evaluation and management by ophthalmology to ensure no further damage to eye given persistent pain.  An appointment at 1 PM was set up at [facility] for patient to be further evaluated and managed today.  Patient was agreeable with appointment and plan.  Patient left to go to eye doctor when she left urgent care today. Final Clinical Impressions(s) / UC Diagnoses   Final diagnoses:  Chemical exposure of eye     Discharge Instructions      Please go to provided address for [facility] today at 12:45 for your 1:00 appointment.    ED Prescriptions   None    PDMP not reviewed this encounter.   Gustavus Bryant, [place] 11/10/21 1242

## 2021-11-10 NOTE — ED Triage Notes (Signed)
Brandy Choi Education administrator

## 2021-11-10 NOTE — ED Triage Notes (Signed)
Patient is concerned she may have sprayed hair chemical in eye.  Reports not directly in eye, but may have grazed right eye.  Patient wiped eye off .  Denies burning.  Now, patient has noticed intermittent throbbing, that she thinks may be related to her rubbing right eye.  No vision changes.  Patient does not wear contacts

## 2021-11-10 NOTE — Discharge Instructions (Signed)
Please go to provided address for [facility] today at 12:45 for your 1:00 appointment.

## 2022-01-19 ENCOUNTER — Other Ambulatory Visit: Payer: Self-pay | Admitting: Obstetrics

## 2022-01-19 DIAGNOSIS — N61 Mastitis without abscess: Secondary | ICD-10-CM

## 2022-02-16 ENCOUNTER — Ambulatory Visit
Admission: EM | Admit: 2022-02-16 | Discharge: 2022-02-16 | Disposition: A | Discharge status: Home / Self Care | Payer: Medicaid Other | Attending: Internal Medicine | Admitting: Internal Medicine

## 2022-02-16 DIAGNOSIS — Z202 Contact with and (suspected) exposure to infections with a predominantly sexual mode of transmission: Secondary | ICD-10-CM | POA: Insufficient documentation

## 2022-02-16 DIAGNOSIS — Z113 Encounter for screening for infections with a predominantly sexual mode of transmission: Secondary | ICD-10-CM | POA: Diagnosis present

## 2022-02-16 DIAGNOSIS — Z3202 Encounter for pregnancy test, result negative: Secondary | ICD-10-CM | POA: Insufficient documentation

## 2022-02-16 LAB — POCT URINE PREGNANCY: Preg Test, Ur: NEGATIVE

## 2022-02-16 MED ORDER — DOXYCYCLINE HYCLATE 100 MG PO CAPS: 100.0000 mg | ORAL_CAPSULE | Freq: Two times a day (BID) | ORAL | 0 refills | Status: DC

## 2022-02-16 NOTE — ED Triage Notes (Signed)
Pt c/o just wants to get tested for std, having no symptoms

## 2022-02-16 NOTE — Discharge Instructions (Addendum)
You are being treated for chlamydia exposure with doxycycline antibiotic.  Please take this medication with food to prevent nausea.  Your vaginal swab is pending.  We will call if it is positive. Urine pregnancy test was negative.

## 2022-02-16 NOTE — ED Provider Notes (Signed)
[facility]    CSN: [id number] Arrival date & time: 02/16/22  1430      History   Chief Complaint Chief Complaint  Patient presents with   SEXUALLY TRANSMITTED DISEASE    HPI Brandy Choi is a 21 y.o. female.   Patient presents for STD testing.  Patient reports that she was exposed to chlamydia approximately 1 month ago after unprotected sexual intercourse.  Denies any associated symptoms including vaginal discharge, abnormal vaginal bleeding, dysuria, urinary frequency, abdominal pain, back pain, fever.  Patient does not want testing for HIV or syphilis.  Last menstrual cycle was about a week ago but patient is not sure the exact date.  Although, patient reports that the vaginal bleeding was less than her typical menstrual cycles.     Past Medical History:  Diagnosis Date   ADHD (attention deficit hyperactivity disorder), combined type    Asthma    Mood disorder ([place])    Obesity    Oppositional defiant disorder     Patient Active Problem List   Diagnosis Date Noted   Postpartum care following cesarean delivery 06/12/2018   Obesity (BMI 30.0-34.9) 01/18/2018   Severe obesity due to excess calories without serious comorbidity with body mass index (BMI) in 99th percentile for age in pediatric patient ([place]) 10/27/2016   Mood disorder ([place]) 10/01/2015   Oppositional defiant disorder 10/01/2015   ADHD (attention deficit hyperactivity disorder), combined type 01/01/2013    Past Surgical History:  Procedure Laterality Date   CESAREAN SECTION N/A 05/28/2018   Procedure: CESAREAN SECTION;  Surgeon: Sloan Leiter, MD;  Location: [place];  Service: Obstetrics;  Laterality: N/A;   WISDOM TOOTH EXTRACTION  2019    OB History     Gravida  1   Para  1   Term  1   Preterm      AB      Living  1      SAB      IAB      Ectopic      Multiple  0   Live Births  1            Home Medications    Prior to Admission medications    Medication Sig Start Date End Date Taking? Authorizing Provider  doxycycline (VIBRAMYCIN) 100 MG capsule Take 1 capsule (100 mg total) by mouth 2 (two) times daily. 02/16/22  Yes , Hildred Alamin E, FNP  methocarbamol (ROBAXIN) 500 MG tablet Take 2 tablets (1,000 mg total) by mouth 4 (four) times daily. 02/20/20   Carlisle Cater, PA-C  naproxen (NAPROSYN) 500 MG tablet Take 1 tablet (500 mg total) by mouth 2 (two) times daily. 02/20/20   Carlisle Cater, PA-C  norgestimate-ethinyl estradiol (ESTARYLLA) 0.25-35 MG-MCG tablet Take 1 tablet by mouth daily. Patient not taking: Reported on 08/03/2021 03/04/21   Osborne Oman, MD  famotidine (PEPCID) 20 MG tablet Take 1 tablet (20 mg total) by mouth 2 (two) times daily. Patient not taking: Reported on 01/29/2020 11/24/19 02/20/20  Renita Papa, PA-C    Family History Family History  Problem Relation Age of Onset   ADD / ADHD Brother    Diabetes Mother    Congestive Heart Failure Maternal Grandmother    Renal Disease Maternal Grandmother    Diabetes Maternal Grandmother    Hypertension Maternal Grandmother     Social History Social History   Tobacco Use   Smoking status: Never   Smokeless tobacco: Never  Vaping  Use   Vaping Use: Never used  Substance Use Topics   Alcohol use: Yes   Drug use: No     Allergies   Patient has no known allergies.   Review of Systems Review of Systems Per HPI  Physical Exam Triage Vital Signs ED Triage Vitals  Enc Vitals Group     BP 02/16/22 1451 121/66     Pulse Rate 02/16/22 1451 (!) 113     Resp --      Temp 02/16/22 1451 99 F (37.2 C)     Temp Source 02/16/22 1451 Oral     SpO2 02/16/22 1451 98 %     Weight --      Height --      Head Circumference --      Peak Flow --      Pain Score 02/16/22 1453 0     Pain Loc --      Pain Edu? --      Excl. in [place]? --    No data found.  Updated Vital Signs BP 121/66 (BP Location: Left Arm)   Pulse (!) 113   Temp 99 F (37.2 C) (Oral)   SpO2  98%   Visual Acuity Right Eye Distance:   Left Eye Distance:   Bilateral Distance:    Right Eye Near:   Left Eye Near:    Bilateral Near:     Physical Exam Constitutional:      General: She is not in acute distress.    Appearance: Normal appearance. She is not toxic-appearing or diaphoretic.  HENT:     Head: Normocephalic and atraumatic.  Eyes:     Extraocular Movements: Extraocular movements intact.     Conjunctiva/sclera: Conjunctivae normal.  Pulmonary:     Effort: Pulmonary effort is normal.  Genitourinary:    Comments: Deferred with shared decision making.  Self swab performed. Neurological:     General: No focal deficit present.     Mental Status: She is alert and oriented to person, place, and time. Mental status is at baseline.  Psychiatric:        Mood and Affect: Mood normal.        Behavior: Behavior normal.        Thought Content: Thought content normal.        Judgment: Judgment normal.      UC Treatments / Results  Labs (all labs ordered are listed, but only abnormal results are displayed) Labs Reviewed  POCT URINE PREGNANCY  CERVICOVAGINAL ANCILLARY ONLY    EKG   Radiology No results found.  Procedures Procedures (including critical care time)  Medications Ordered in UC Medications - No data to display  Initial Impression / Assessment and Plan / UC Course  I have reviewed the triage vital signs and the nursing notes.  Pertinent labs & imaging results that were available during my care of the patient were reviewed by me and considered in my medical decision making (see chart for details).     Given associated chlamydia exposure, will opt to prophylactically treat with doxycycline.  Will defer UA given no associated urinary symptoms.  Urine pregnancy was negative.  Cervicovaginal swab pending.  Will await results for any further treatment.  Patient advised of safe sex practices and to avoid sexual activity until test results and treatment  are complete.  Patient has mild tachycardia during triage but this appears baseline for patient.  No further work-up for this is necessary.  Discussed return precautions.  Patient verbalized understanding and was agreeable with plan. Final Clinical Impressions(s) / UC Diagnoses   Final diagnoses:  Exposure to chlamydia  Screening examination for venereal disease  Urine pregnancy test negative     Discharge Instructions      You are being treated for chlamydia exposure with doxycycline antibiotic.  Please take this medication with food to prevent nausea.  Your vaginal swab is pending.  We will call if it is positive. Urine pregnancy test was negative.      ED Prescriptions     Medication Sig Dispense Auth. Provider   doxycycline (VIBRAMYCIN) 100 MG capsule Take 1 capsule (100 mg total) by mouth 2 (two) times daily. 20 capsule Teodora Medici, [place]      PDMP not reviewed this encounter.   Teodora Medici, [place] 02/16/22 1520

## 2022-02-17 ENCOUNTER — Telehealth (HOSPITAL_COMMUNITY): Payer: Self-pay | Admitting: Emergency Medicine

## 2022-02-17 LAB — CERVICOVAGINAL ANCILLARY ONLY
Bacterial Vaginitis (gardnerella): POSITIVE — AB
Candida Glabrata: NEGATIVE
Candida Vaginitis: POSITIVE — AB
Chlamydia: NEGATIVE
Comment: NEGATIVE
Comment: NEGATIVE
Comment: NEGATIVE
Comment: NEGATIVE
Comment: NEGATIVE
Comment: NORMAL
Neisseria Gonorrhea: NEGATIVE
Trichomonas: NEGATIVE

## 2022-02-17 MED ORDER — FLUCONAZOLE 150 MG PO TABS: 150.0000 mg | ORAL_TABLET | Freq: Once | ORAL | 0 refills | Status: AC

## 2022-02-17 MED ORDER — METRONIDAZOLE 500 MG PO TABS: 500.0000 mg | ORAL_TABLET | Freq: Two times a day (BID) | ORAL | 0 refills | Status: DC

## 2022-09-12 ENCOUNTER — Ambulatory Visit
Admission: EM | Admit: 2022-09-12 | Discharge: 2022-09-12 | Disposition: A | Discharge status: Home / Self Care | Payer: Medicaid Other | Attending: Emergency Medicine | Admitting: Emergency Medicine

## 2022-09-12 DIAGNOSIS — M25561 Pain in right knee: Secondary | ICD-10-CM

## 2022-09-12 MED ORDER — IBUPROFEN 600 MG PO TABS: 600.0000 mg | ORAL_TABLET | Freq: Three times a day (TID) | ORAL | 0 refills | Status: DC

## 2022-09-12 NOTE — Discharge Instructions (Signed)
Use cooling gel or something like IcyHot per package directions. Take ibuprofen on a schedule for a week - if the pain is not getting better after that, contact sports medicine for an appointment to get further help with your knee.

## 2022-09-12 NOTE — ED Provider Notes (Signed)
[facility]    CSN: [id number] Arrival date & time: 09/12/22  1417      History   Chief Complaint Chief Complaint  Patient presents with   gap in knee    HPI Brandy Choi is a 22 y.o. female. Pt works in [employer] and works long shifts, sometimes up to 14 hours. 2 weeks ago, her R knee began hurting in the anterior aspect after work. Doesn't hurt constantly but hurts worse after standing all day at work. Denies specific injury but pt states she does slip at work often due to slippery floors. Has not fallen. Her mom gave her "cooling gel" to apply to it last night and that helped a little. She left work today due to pain   HPI  Past Medical History:  Diagnosis Date   ADHD (attention deficit hyperactivity disorder), combined type    Asthma    Mood disorder    Obesity    Oppositional defiant disorder     Patient Active Problem List   Diagnosis Date Noted   Postpartum care following cesarean delivery 06/12/2018   Obesity (BMI 30.0-34.9) 01/18/2018   Severe obesity due to excess calories without serious comorbidity with body mass index (BMI) in 99th percentile for age in pediatric patient 10/27/2016   Mood disorder 10/01/2015   Oppositional defiant disorder 10/01/2015   ADHD (attention deficit hyperactivity disorder), combined type 01/01/2013    Past Surgical History:  Procedure Laterality Date   CESAREAN SECTION N/A 05/28/2018   Procedure: CESAREAN SECTION;  Surgeon: Conan Bowens, MD;  Location: [facility];  Service: Obstetrics;  Laterality: N/A;   WISDOM TOOTH EXTRACTION  2019    OB History     Gravida  1   Para  1   Term  1   Preterm      AB      Living  1      SAB      IAB      Ectopic      Multiple  0   Live Births  1            Home Medications    Prior to Admission medications   Medication Sig Start Date End Date Taking? Authorizing Provider  ibuprofen (ADVIL) 600 MG tablet Take 1 tablet (600 mg total) by  mouth 3 (three) times daily. 09/12/22  Yes Cathlyn Parsons, NP  doxycycline (VIBRAMYCIN) 100 MG capsule Take 1 capsule (100 mg total) by mouth 2 (two) times daily. 02/16/22   Gustavus Bryant, FNP  methocarbamol (ROBAXIN) 500 MG tablet Take 2 tablets (1,000 mg total) by mouth 4 (four) times daily. 02/20/20   Renne Crigler, PA-C  metroNIDAZOLE (FLAGYL) 500 MG tablet Take 1 tablet (500 mg total) by mouth 2 (two) times daily. 02/17/22   Merrilee Jansky, MD  norgestimate-ethinyl estradiol (ESTARYLLA) 0.25-35 MG-MCG tablet Take 1 tablet by mouth daily. Patient not taking: Reported on 08/03/2021 03/04/21   Tereso Newcomer, MD  famotidine (PEPCID) 20 MG tablet Take 1 tablet (20 mg total) by mouth 2 (two) times daily. Patient not taking: Reported on 01/29/2020 11/24/19 02/20/20  Jeanie Sewer, PA-C    Family History Family History  Problem Relation Age of Onset   ADD / ADHD Brother    Diabetes Mother    Congestive Heart Failure Maternal Grandmother    Renal Disease Maternal Grandmother    Diabetes Maternal Grandmother    Hypertension Maternal Grandmother  Social History Social History   Tobacco Use   Smoking status: Never   Smokeless tobacco: Never  Vaping Use   Vaping Use: Never used  Substance Use Topics   Alcohol use: Yes   Drug use: No     Allergies   Patient has no known allergies.   Review of Systems Review of Systems   Physical Exam Triage Vital Signs ED Triage Vitals [09/12/22 1436]  Enc Vitals Group     BP 112/74     Pulse Rate 100     Resp 16     Temp 98.2 F (36.8 C)     Temp Source Oral     SpO2 97 %     Weight      Height      Head Circumference      Peak Flow      Pain Score 5     Pain Loc      Pain Edu?      Excl. in GC?    No data found.  Updated Vital Signs BP 112/74 (BP Location: Left Arm)   Pulse 100   Temp 98.2 F (36.8 C) (Oral)   Resp 16   SpO2 97%   Visual Acuity Right Eye Distance:   Left Eye Distance:   Bilateral Distance:     Right Eye Near:   Left Eye Near:    Bilateral Near:     Physical Exam Constitutional:      General: She is not in acute distress.    Appearance: Normal appearance. She is obese.  Pulmonary:     Effort: Pulmonary effort is normal.  Musculoskeletal:     Right knee: Bony tenderness present. No swelling or deformity. Normal range of motion. No LCL laxity, MCL laxity, ACL laxity or PCL laxity.     Left knee: Normal.  Neurological:     Mental Status: She is alert.     Gait: Gait normal.      UC Treatments / Results  Labs (all labs ordered are listed, but only abnormal results are displayed) Labs Reviewed - No data to display  EKG   Radiology No results found.  Procedures Procedures (including critical care time)  Medications Ordered in UC Medications - No data to display  Initial Impression / Assessment and Plan / UC Course  I have reviewed the triage vital signs and the nursing notes.  Pertinent labs & imaging results that were available during my care of the patient were reviewed by me and considered in my medical decision making (see chart for details).    Will try conservative therapies for now. Pt to f/u with sports medicine if her knee is not getting better.   Final Clinical Impressions(s) / UC Diagnoses   Final diagnoses:  Acute pain of right knee     Discharge Instructions      Use cooling gel or something like IcyHot per package directions. Take ibuprofen on a schedule for a week - if the pain is not getting better after that, contact sports medicine for an appointment to get further help with your knee.    ED Prescriptions     Medication Sig Dispense Auth. Provider   ibuprofen (ADVIL) 600 MG tablet Take 1 tablet (600 mg total) by mouth 3 (three) times daily. 30 tablet Cathlyn Parsons, NP      PDMP not reviewed this encounter.   Cathlyn Parsons, NP 09/12/22 1511

## 2022-09-12 NOTE — ED Triage Notes (Signed)
Pt c/o right knee discomfort patient is having a difficult time articulating what is happening. Denies known direct trauma/injury. Says "it feels like there's a gap in my knee." Onset ~ 1-2 weeks ago. "It only hurt real bad when I'm getting off work."

## 2024-02-21 ENCOUNTER — Ambulatory Visit
Admission: RE | Admit: 2024-02-21 | Discharge: 2024-02-21 | Disposition: A | Discharge status: Home / Self Care | Source: Ambulatory Visit | Attending: Nurse Practitioner | Admitting: Nurse Practitioner

## 2024-02-21 ENCOUNTER — Other Ambulatory Visit: Payer: Self-pay

## 2024-02-21 VITALS — BP 111/78 | HR 93 | Temp 98.2°F | Resp 16

## 2024-02-21 DIAGNOSIS — N898 Other specified noninflammatory disorders of vagina: Secondary | ICD-10-CM | POA: Insufficient documentation

## 2024-02-21 DIAGNOSIS — Z113 Encounter for screening for infections with a predominantly sexual mode of transmission: Secondary | ICD-10-CM | POA: Diagnosis present

## 2024-02-21 DIAGNOSIS — L292 Pruritus vulvae: Secondary | ICD-10-CM | POA: Insufficient documentation

## 2024-02-21 DIAGNOSIS — Z3202 Encounter for pregnancy test, result negative: Secondary | ICD-10-CM | POA: Diagnosis present

## 2024-02-21 LAB — POCT URINE PREGNANCY: Preg Test, Ur: NEGATIVE

## 2024-02-21 MED ORDER — FLUCONAZOLE 150 MG PO TABS: 150.0000 mg | ORAL_TABLET | ORAL | 0 refills | Status: AC

## 2024-02-21 NOTE — ED Provider Notes (Signed)
 [facility]    CSN: [id number] Arrival date & time: 02/21/24  1816      History   Chief Complaint Chief Complaint  Patient presents with   Vaginal Itching    HPI Brandy Choi is a 23 y.o. female.   Discussed the use of AI scribe software for clinical note transcription with the patient, who gave verbal consent to proceed.   The patient presents with vaginal itching that began around the time her menstrual period started 3 days ago. She is currently still menstruating. The patient denies any abnormal vaginal discharge. She reports no dysuria. The patient is sexually active with female partners, has had one partner in the past 3 months, and uses condoms sometimes but not consistently. She is not currently using birth control.  The following sections of the patient's history were reviewed and updated as appropriate: allergies, current medications, past family history, past medical history, past social history, past surgical history, and problem list.      Past Medical History:  Diagnosis Date   ADHD (attention deficit hyperactivity disorder), combined type    Asthma    Mood disorder    Obesity    Obesity (BMI 30.0-34.9) 01/18/2018   Oppositional defiant disorder     Patient Active Problem List   Diagnosis Date Noted   Major depressive disorder, recurrent, moderate (HCC) 02/18/2020   Generalized anxiety disorder 11/01/2019   Postpartum care following cesarean delivery 06/12/2018   Severe obesity due to excess calories without serious comorbidity with body mass index (BMI) in 99th percentile for age in pediatric patient (HCC) 10/27/2016   Mood disorder 10/01/2015   Oppositional defiant disorder 10/01/2015   ADHD (attention deficit hyperactivity disorder), combined type 01/01/2013    Past Surgical History:  Procedure Laterality Date   CESAREAN SECTION N/A 05/28/2018   Procedure: CESAREAN SECTION;  Surgeon: Nicholaus Burnard HERO, MD;  Location: [facility]IRTH[facility];   Service: Obstetrics;  Laterality: N/A;   WISDOM TOOTH EXTRACTION  2019    OB History     Gravida  1   Para  1   Term  1   Preterm      AB      Living  1      SAB      IAB      Ectopic      Multiple  0   Live Births  1            Home Medications    Prior to Admission medications   Medication Sig Start Date End Date Taking? Authorizing Provider  fluconazole  (DIFLUCAN ) 150 MG tablet Take 1 tablet (150 mg total) by mouth every 3 (three) days for 2 doses. 02/21/24 02/25/24 Yes Iola Lukes, FNP  famotidine  (PEPCID ) 20 MG tablet Take 1 tablet (20 mg total) by mouth 2 (two) times daily. Patient not taking: Reported on 01/29/2020 11/24/19 02/20/20  Meryle Josetta LABOR, PA-C    Family History Family History  Problem Relation Age of Onset   ADD / ADHD Brother    Diabetes Mother    Congestive Heart Failure Maternal Grandmother    Renal Disease Maternal Grandmother    Diabetes Maternal Grandmother    Hypertension Maternal Grandmother     Social History Social History   Tobacco Use   Smoking status: Never   Smokeless tobacco: Never  Vaping Use   Vaping status: Never Used  Substance Use Topics   Alcohol use: Not Currently   Drug use: No  Allergies   Patient has no known allergies.   Review of Systems Review of Systems  Genitourinary:  Negative for dysuria, menstrual problem (LMP 3 days ago) and vaginal discharge.       Vaginal irritation, itching and odor.   All other systems reviewed and are negative.    Physical Exam Triage Vital Signs ED Triage Vitals  Encounter Vitals Group     BP 02/21/24 1849 111/78     Girls Systolic BP Percentile --      Girls Diastolic BP Percentile --      Boys Systolic BP Percentile --      Boys Diastolic BP Percentile --      Pulse Rate 02/21/24 1849 93     Resp 02/21/24 1849 16     Temp 02/21/24 1849 98.2 F (36.8 C)     Temp Source 02/21/24 1849 Oral     SpO2 02/21/24 1849 97 %     Weight --      Height --       Head Circumference --      Peak Flow --      Pain Score 02/21/24 1847 3     Pain Loc --      Pain Education --      Exclude from Growth Chart --    No data found.  Updated Vital Signs BP 111/78 (BP Location: Right Arm)   Pulse 93   Temp 98.2 F (36.8 C) (Oral)   Resp 16   LMP 02/18/2024 (Approximate)   SpO2 97%   Visual Acuity Right Eye Distance:   Left Eye Distance:   Bilateral Distance:    Right Eye Near:   Left Eye Near:    Bilateral Near:     Physical Exam Constitutional:      General: She is not in acute distress.    Appearance: Normal appearance. She is obese. She is not ill-appearing, toxic-appearing or diaphoretic.  HENT:     Head: Normocephalic.     Nose: Nose normal.     Mouth/Throat:     Mouth: Mucous membranes are moist.  Eyes:     Conjunctiva/sclera: Conjunctivae normal.  Cardiovascular:     Rate and Rhythm: Normal rate.  Pulmonary:     Effort: Pulmonary effort is normal.  Abdominal:     Palpations: Abdomen is soft.  Genitourinary:    Comments: Deferred; patient performed self-swab for Aptima testing  Musculoskeletal:        General: Normal range of motion.     Cervical back: Normal range of motion and neck supple.  Skin:    General: Skin is warm and dry.  Neurological:     General: No focal deficit present.     Mental Status: She is alert and oriented to person, place, and time.  Psychiatric:        Mood and Affect: Mood normal.        Behavior: Behavior normal.      UC Treatments / Results  Labs (all labs ordered are listed, but only abnormal results are displayed) Labs Reviewed  POCT URINE PREGNANCY - Normal  CERVICOVAGINAL ANCILLARY ONLY    EKG   Radiology No results found.  Procedures Procedures (including critical care time)  Medications Ordered in UC Medications - No data to display  Initial Impression / Assessment and Plan / UC Course  I have reviewed the triage vital signs and the nursing notes.  Pertinent  labs & imaging results that were available during  my care of the patient were reviewed by me and considered in my medical decision making (see chart for details).     Patient presents with vaginal itching. Vaginal swabs were collected for gonorrhea, chlamydia, trichomonas, yeast, and bacterial vaginosis testing; results are pending. Empiric treatment initiated with fluconazole  (Diflucan ). Further management will be guided by pending test results.  The patient was advised to maintain adequate hydration and to use barrier protection if sexually active to help reduce the risk of recurrent infection. She was informed that she will be contacted only if results are positive, though all results will be available for review through her MyChart account. She was instructed to follow up with her primary care provider or gynecologist if symptoms do not improve with treatment and to seek medical attention sooner if she develops fever, pelvic pain, or worsening discharge.  Today's evaluation has revealed no signs of a dangerous process. Discussed diagnosis with patient and/or guardian. Patient and/or guardian aware of their diagnosis, possible red flag symptoms to watch out for and need for close follow up. Patient and/or guardian understands verbal and written discharge instructions. Patient and/or guardian comfortable with plan and disposition.  Patient and/or guardian has a clear mental status at this time, good insight into illness (after discussion and teaching) and has clear judgment to make decisions regarding their care  Documentation was completed with the aid of voice recognition software. Transcription may contain typographical errors. Final Clinical Impressions(s) / UC Diagnoses   Final diagnoses:  Vaginal itching     Discharge Instructions      You were seen today for vaginal itching which can occur when the normal balance of bacteria in the vagina changes. This can be influenced by various  factors, including new or multiple sexual partners, unprotected sex, douching, smoking, certain antibiotics, or pregnancy. It's also possible to develop a vaginal infection even without being sexually active. Tests were performed today to check for bacteria, yeast, gonorrhea, chlamydia, and trichomonas. While results are pending, treatment has been initiated for possible yeast infection based on your symptoms. It is important that you avoid any sexual activity until your test results have returned, your treatment is complete, and your symptoms have fully resolved. You will only be contacted if any of your test results are positive. You can also review your results through your MyChart account. During this time, avoid douching or using vaginal sprays or deodorants. Wear cotton or cotton-lined underwear to improve airflow and reduce moisture. Be sure to stay hydrated by drinking plenty of fluids. See your regular doctor or gynecologist if your symptoms do not start to improve with treatment. Go to the emergency room right away if you develop a fever, new or worsening pelvic pain, or if the vaginal discharge gets worse.        ED Prescriptions     Medication Sig Dispense Auth. Provider   fluconazole  (DIFLUCAN ) 150 MG tablet Take 1 tablet (150 mg total) by mouth every 3 (three) days for 2 doses. 2 tablet Iola Lukes, FNP      PDMP not reviewed this encounter.   Iola Lukes, [place] 02/21/24 [facility]

## 2024-02-21 NOTE — Discharge Instructions (Addendum)
 You were seen today for vaginal itching which can occur when the normal balance of bacteria in the vagina changes. This can be influenced by various factors, including new or multiple sexual partners, unprotected sex, douching, smoking, certain antibiotics, or pregnancy. It's also possible to develop a vaginal infection even without being sexually active. Tests were performed today to check for bacteria, yeast, gonorrhea, chlamydia, and trichomonas. While results are pending, treatment has been initiated for possible yeast infection based on your symptoms. It is important that you avoid any sexual activity until your test results have returned, your treatment is complete, and your symptoms have fully resolved. You will only be contacted if any of your test results are positive. You can also review your results through your MyChart account. During this time, avoid douching or using vaginal sprays or deodorants. Wear cotton or cotton-lined underwear to improve airflow and reduce moisture. Be sure to stay hydrated by drinking plenty of fluids. See your regular doctor or gynecologist if your symptoms do not start to improve with treatment. Go to the emergency room right away if you develop a fever, new or worsening pelvic pain, or if the vaginal discharge gets worse.

## 2024-02-21 NOTE — ED Triage Notes (Signed)
 The labia part of my vagina has like a irritated feeling - Entered by patient  Pt reports symptoms have been going on for 3 days. Denies UTI symptoms. Voices concern for yeast infection. She tried monistat this morning with some relief.

## 2024-02-22 LAB — CERVICOVAGINAL ANCILLARY ONLY
Bacterial Vaginitis (gardnerella): POSITIVE — AB
Candida Glabrata: NEGATIVE
Candida Vaginitis: POSITIVE — AB
Chlamydia: NEGATIVE
Comment: NEGATIVE
Comment: NEGATIVE
Comment: NEGATIVE
Comment: NEGATIVE
Comment: NEGATIVE
Comment: NORMAL
Neisseria Gonorrhea: POSITIVE — AB
Trichomonas: POSITIVE — AB

## 2024-02-23 ENCOUNTER — Telehealth: Payer: Self-pay | Admitting: Emergency Medicine

## 2024-02-23 ENCOUNTER — Ambulatory Visit (HOSPITAL_COMMUNITY): Payer: Self-pay

## 2024-02-23 ENCOUNTER — Encounter: Payer: Self-pay | Admitting: Emergency Medicine

## 2024-02-23 ENCOUNTER — Ambulatory Visit
Admission: EM | Admit: 2024-02-23 | Discharge: 2024-02-23 | Disposition: A | Discharge status: Home / Self Care | Attending: Internal Medicine | Admitting: Internal Medicine

## 2024-02-23 DIAGNOSIS — A549 Gonococcal infection, unspecified: Secondary | ICD-10-CM | POA: Diagnosis not present

## 2024-02-23 MED ORDER — METRONIDAZOLE 500 MG PO TABS: 500.0000 mg | ORAL_TABLET | Freq: Two times a day (BID) | ORAL | 0 refills | Status: AC

## 2024-02-23 MED ORDER — CEFTRIAXONE SODIUM 500 MG IJ SOLR
500.0000 mg | INTRAMUSCULAR | Status: DC
  Administered 2024-02-23: 500 mg via INTRAMUSCULAR

## 2024-02-23 NOTE — ED Notes (Signed)
 Pt here to receive rocephin  injection due to positive STD results.

## 2024-02-23 NOTE — Telephone Encounter (Signed)
 Pt called in with concerns about results received in her MyChart. Cyto swab results posted at 7:07 pm on 02/22/2024. Pt was advised that she would receive a callback to discuss results and treatment to follow most likely today (02/23/2024). Pt verbally expressed that she didn't understand the process and was concerned why her medication was not prescribed immediately once results were released. Attempt was made to explain the process again. Pt disconnected phone call. No further action required.

## 2024-05-05 ENCOUNTER — Ambulatory Visit: Admission: EM | Admit: 2024-05-05 | Discharge: 2024-05-05 | Disposition: A | Discharge status: Home / Self Care

## 2024-05-05 ENCOUNTER — Encounter: Payer: Self-pay | Admitting: Emergency Medicine

## 2024-05-05 DIAGNOSIS — N760 Acute vaginitis: Secondary | ICD-10-CM | POA: Diagnosis not present

## 2024-05-05 MED ORDER — FLUCONAZOLE 150 MG PO TABS: ORAL_TABLET | ORAL | 1 refills | Status: AC

## 2024-05-05 NOTE — ED Provider Notes (Signed)
 [facility]    CSN: [id number] Arrival date & time: 05/05/24  1327      History   Chief Complaint Chief Complaint  Patient presents with   Exposure to STD    HPI Brandy Choi is a 23 y.o. female.   Pt presents today due to vaginal pain and white discharge after using public restroom. Pt denies change in soap, dysuria, or frequency. Pt is concerned about STIs.   The history is provided by the patient.  Exposure to STD    Past Medical History:  Diagnosis Date   ADHD (attention deficit hyperactivity disorder), combined type    Asthma    Mood disorder    Obesity    Obesity (BMI 30.0-34.9) 01/18/2018   Oppositional defiant disorder     Patient Active Problem List   Diagnosis Date Noted   Major depressive disorder, recurrent, moderate (HCC) 02/18/2020   Generalized anxiety disorder 11/01/2019   Postpartum care following cesarean delivery 06/12/2018   Severe obesity due to excess calories without serious comorbidity with body mass index (BMI) in 99th percentile for age in pediatric patient (HCC) 10/27/2016   Mood disorder 10/01/2015   Oppositional defiant disorder 10/01/2015   ADHD (attention deficit hyperactivity disorder), combined type 01/01/2013    Past Surgical History:  Procedure Laterality Date   CESAREAN SECTION N/A 05/28/2018   Procedure: CESAREAN SECTION;  Surgeon: Nicholaus Burnard HERO, MD;  Location: [facility]IRTHING SU[facility];  Service: Obstetrics;  Laterality: N/A;   WISDOM TOOTH EXTRACTION  2019    OB History     Gravida  1   Para  1   Term  1   Preterm      AB      Living  1      SAB      IAB      Ectopic      Multiple  0   Live Births  1            Home Medications    Prior to Admission medications  Medication Sig Start Date End Date Taking? Authorizing Provider  fluconazole  (DIFLUCAN ) 150 MG tablet Take 1 tab po every 3 days 05/05/24  Yes Andra Krabbe C, PA-C  famotidine  (PEPCID ) 20 MG tablet Take 1 tablet (20  mg total) by mouth 2 (two) times daily. Patient not taking: Reported on 01/29/2020 11/24/19 02/20/20  Meryle Josetta LABOR, PA-C    Family History Family History  Problem Relation Age of Onset   ADD / ADHD Brother    Diabetes Mother    Congestive Heart Failure Maternal Grandmother    Renal Disease Maternal Grandmother    Diabetes Maternal Grandmother    Hypertension Maternal Grandmother     Social History Social History[1]   Allergies   Patient has no known allergies.   Review of Systems Review of Systems   Physical Exam Triage Vital Signs ED Triage Vitals  Encounter Vitals Group     BP 05/05/24 1411 124/83     Girls Systolic BP Percentile --      Girls Diastolic BP Percentile --      Boys Systolic BP Percentile --      Boys Diastolic BP Percentile --      Pulse Rate 05/05/24 1411 (!) 105     Resp 05/05/24 1411 18     Temp 05/05/24 1411 98.5 F (36.9 C)     Temp Source 05/05/24 1411 Oral     SpO2 05/05/24 1411 97 %  Weight 05/05/24 1410 245 lb (111.1 kg)     Height --      Head Circumference --      Peak Flow --      Pain Score 05/05/24 1409 7     Pain Loc --      Pain Education --      Exclude from Growth Chart --    No data found.  Updated Vital Signs BP 124/83 (BP Location: Left Arm)   Pulse (!) 105   Temp 98.5 F (36.9 C) (Oral)   Resp 18   Wt 245 lb (111.1 kg)   LMP 04/16/2024 (Exact Date)   SpO2 97%   BMI 42.72 kg/m   Visual Acuity Right Eye Distance:   Left Eye Distance:   Bilateral Distance:    Right Eye Near:   Left Eye Near:    Bilateral Near:     Physical Exam Vitals and nursing note reviewed.  Constitutional:      General: She is not in acute distress.    Appearance: Normal appearance. She is not ill-appearing, toxic-appearing or diaphoretic.  Eyes:     General: No scleral icterus. Cardiovascular:     Rate and Rhythm: Normal rate and regular rhythm.     Heart sounds: Normal heart sounds.  Pulmonary:     Effort: Pulmonary effort  is normal. No respiratory distress.     Breath sounds: Normal breath sounds. No wheezing or rhonchi.  Abdominal:     General: Abdomen is flat. Bowel sounds are normal.     Palpations: Abdomen is soft.     Tenderness: There is no abdominal tenderness. There is no right CVA tenderness or left CVA tenderness.  Skin:    General: Skin is warm.  Neurological:     Mental Status: She is alert and oriented to person, place, and time.  Psychiatric:        Mood and Affect: Mood normal.        Behavior: Behavior normal.      UC Treatments / Results  Labs (all labs ordered are listed, but only abnormal results are displayed) Labs Reviewed  CERVICOVAGINAL ANCILLARY ONLY    EKG   Radiology No results found.  Procedures Procedures (including critical care time)  Medications Ordered in UC Medications - No data to display  Initial Impression / Assessment and Plan / UC Course  I have reviewed the triage vital signs and the nursing notes.  Pertinent labs & imaging results that were available during my care of the patient were reviewed by me and considered in my medical decision making (see chart for details).    Final Clinical Impressions(s) / UC Diagnoses   Final diagnoses:  Acute vaginitis   Discharge Instructions   None    ED Prescriptions     Medication Sig Dispense Auth. Provider   fluconazole  (DIFLUCAN ) 150 MG tablet Take 1 tab po every 3 days 2 tablet Andra Corean BROCKS, PA-C      PDMP not reviewed this encounter.    [1]  Social History Tobacco Use   Smoking status: Never    Passive exposure: Never   Smokeless tobacco: Never  Vaping Use   Vaping status: Never Used  Substance Use Topics   Alcohol use: Not Currently   Drug use: No     Andra Corean BROCKS, PA-C 05/05/24 1509

## 2024-05-05 NOTE — ED Triage Notes (Signed)
 Pt presents requesting STD testing . Pt reports sxs as burning sensation in external vaginal area.   Pt states, after I used the bathroom at a gas station I have had a burning sensation on the outside of my vagina.

## 2024-05-06 ENCOUNTER — Ambulatory Visit (HOSPITAL_COMMUNITY): Payer: Self-pay

## 2024-05-06 ENCOUNTER — Ambulatory Visit: Payer: Self-pay

## 2024-05-06 LAB — CERVICOVAGINAL ANCILLARY ONLY
Bacterial Vaginitis (gardnerella): POSITIVE — AB
Candida Glabrata: NEGATIVE
Candida Vaginitis: POSITIVE — AB
Chlamydia: NEGATIVE
Comment: NEGATIVE
Comment: NEGATIVE
Comment: NEGATIVE
Comment: NEGATIVE
Comment: NEGATIVE
Comment: NORMAL
Neisseria Gonorrhea: NEGATIVE
Trichomonas: NEGATIVE

## 2024-05-06 MED ORDER — METRONIDAZOLE 500 MG PO TABS: 500.0000 mg | ORAL_TABLET | Freq: Two times a day (BID) | ORAL | 0 refills | Status: AC
